# Patient Record
Sex: Female | Born: 1975 | Race: White | Hispanic: No | Marital: Married | Smoking: Never smoker
Health system: Southern US, Community
[De-identification: ages and names within clinical notes are randomized; demographics above are authoritative.]

## PROBLEM LIST (undated history)

## (undated) DIAGNOSIS — K625 Hemorrhage of anus and rectum: Secondary | ICD-10-CM

## (undated) DIAGNOSIS — K649 Unspecified hemorrhoids: Secondary | ICD-10-CM

## (undated) DIAGNOSIS — K5900 Constipation, unspecified: Secondary | ICD-10-CM

## (undated) DIAGNOSIS — N201 Calculus of ureter: Secondary | ICD-10-CM

## (undated) DIAGNOSIS — Z87442 Personal history of urinary calculi: Secondary | ICD-10-CM

## (undated) DIAGNOSIS — N6019 Diffuse cystic mastopathy of unspecified breast: Secondary | ICD-10-CM

## (undated) HISTORY — DX: Hemorrhage of anus and rectum: K62.5

## (undated) HISTORY — DX: Constipation, unspecified: K59.00

## (undated) HISTORY — PX: HAND SURGERY: SHX662

## (undated) HISTORY — DX: Unspecified hemorrhoids: K64.9

---
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
## 2006-04-09 ENCOUNTER — Inpatient Hospital Stay: Payer: Self-pay

## 2008-10-14 ENCOUNTER — Ambulatory Visit: Payer: Self-pay | Admitting: Internal Medicine

## 2009-05-29 ENCOUNTER — Emergency Department: Payer: Self-pay | Admitting: Emergency Medicine

## 2013-01-17 ENCOUNTER — Ambulatory Visit: Payer: Self-pay

## 2015-09-07 DIAGNOSIS — R55 Syncope and collapse: Secondary | ICD-10-CM | POA: Insufficient documentation

## 2017-09-26 DIAGNOSIS — N201 Calculus of ureter: Secondary | ICD-10-CM

## 2017-09-26 DIAGNOSIS — N6019 Diffuse cystic mastopathy of unspecified breast: Secondary | ICD-10-CM

## 2017-09-26 HISTORY — DX: Calculus of ureter: N20.1

## 2017-09-26 HISTORY — DX: Diffuse cystic mastopathy of unspecified breast: N60.19

## 2018-01-10 ENCOUNTER — Encounter: Payer: Self-pay | Admitting: Certified Nurse Midwife

## 2018-01-10 ENCOUNTER — Ambulatory Visit (INDEPENDENT_AMBULATORY_CARE_PROVIDER_SITE_OTHER): Payer: BLUE CROSS/BLUE SHIELD | Admitting: Certified Nurse Midwife

## 2018-01-10 VITALS — BP 102/62 | HR 70 | Ht 69.0 in | Wt 168.0 lb

## 2018-01-10 DIAGNOSIS — Z1239 Encounter for other screening for malignant neoplasm of breast: Secondary | ICD-10-CM

## 2018-01-10 DIAGNOSIS — Z124 Encounter for screening for malignant neoplasm of cervix: Secondary | ICD-10-CM | POA: Diagnosis not present

## 2018-01-10 DIAGNOSIS — Z01419 Encounter for gynecological examination (general) (routine) without abnormal findings: Secondary | ICD-10-CM

## 2018-01-10 DIAGNOSIS — Z13220 Encounter for screening for lipoid disorders: Secondary | ICD-10-CM

## 2018-01-10 DIAGNOSIS — Z1231 Encounter for screening mammogram for malignant neoplasm of breast: Secondary | ICD-10-CM | POA: Diagnosis not present

## 2018-01-10 NOTE — Progress Notes (Signed)
Gynecology Annual Exam  PCP: Patient, No Pcp Per  Chief Complaint:  Chief Complaint  Patient presents with  . Gynecologic Exam    History of Present Illness:This is 42 year old Caucasian/White female a married , G 2 P 2 0 0 2 , whose last normal menstrual period was 12/25/2017. . She presents today for her annual exam. She denies any significant gynecological problems. Does have hemorrhoids which flare occasionally and cause some bleeding. Since her last annual GYN exam on 02/01/2016, she has had no significant changes in her general health history.  Her last pap was approximately 2 years ago (02/01/2016) and was NIL/neg HRHPV. Her menses are regular. They occur every month, last 5-6 days, with 2 heavier days requiring a pad and tampon change q 3-4 hours, and are without clots. She denies dysmenorrhea. She is sexually active and vasectomy is her current form of contraception.  She performs breast self-exams monthly. She reports no breast symptoms. Her last mammogram was 01/17/2013 and was benign. There is no family history of breast cancer.  There is no family history of colon cancer.  The patient has not had a lipid panel test.  The patient does not exercise regularly. Currently does not take an OTC calcium supplement, but gets adequate calcium from her diet.  The patient does not smoke.  She does not drink alcohol.     Review of Systems: Review of Systems  Constitutional: Negative for chills, fever and weight loss.  HENT: Negative for congestion, sinus pain and sore throat.   Eyes: Negative for blurred vision and pain.  Respiratory: Negative for hemoptysis, shortness of breath and wheezing.   Cardiovascular: Negative for chest pain, palpitations and leg swelling.  Gastrointestinal: Negative for abdominal pain, blood in stool, diarrhea, heartburn, nausea and vomiting.       Positive for hemorrhoids/ bleeding from hemorrhoids occasionally; positive for reflux-uses Prilosec 3x/month    Genitourinary: Negative for dysuria, frequency, hematuria and urgency.  Musculoskeletal: Negative for back pain, joint pain and myalgias.  Skin: Negative for itching and rash.  Neurological: Negative for dizziness, tingling and headaches.  Endo/Heme/Allergies: Negative for environmental allergies and polydipsia. Does not bruise/bleed easily.       Negative for hirsutism   Psychiatric/Behavioral: Negative for depression. The patient is not nervous/anxious and does not have insomnia.     Past Medical History:  Past Medical History:  Diagnosis Date  . Hemorrhoids     Past Surgical History:  Past Surgical History:  Procedure Laterality Date  . HAND SURGERY     as a child    Family History:  Family History  Problem Relation Age of Onset  . Thyroid disease Mother   . Osteoporosis Mother   . Diabetes Father   . Hyperlipidemia Father   . Esophageal cancer Father 48  . Hypertension Father     Social History:  Social History   Socioeconomic History  . Marital status: Married    Spouse name: Not on file  . Number of children: 2  . Years of education: Not on file  . Highest education level: Not on file  Occupational History  . Not on file  Social Needs  . Financial resource strain: Not on file  . Food insecurity:    Worry: Not on file    Inability: Not on file  . Transportation needs:    Medical: Not on file    Non-medical: Not on file  Tobacco Use  . Smoking status:  Never Smoker  . Smokeless tobacco: Never Used  Substance and Sexual Activity  . Alcohol use: Never    Frequency: Never  . Drug use: Never  . Sexual activity: Yes    Birth control/protection: None, Other-see comments    Comment: vasectomy  Lifestyle  . Physical activity:    Days per week: 0 days    Minutes per session: 0 min  . Stress: Not at all  Relationships  . Social connections:    Talks on phone: Not on file    Gets together: Not on file    Attends religious service: Not on file     Active member of club or organization: Not on file    Attends meetings of clubs or organizations: Not on file    Relationship status: Not on file  . Intimate partner violence:    Fear of current or ex partner: Not on file    Emotionally abused: Not on file    Physically abused: Not on file    Forced sexual activity: Not on file  Other Topics Concern  . Not on file  Social History Narrative  . Not on file    Allergies:  No Known Allergies  Medications:none  Physical Exam Vitals: BP 102/62   Pulse 70   Ht 5\' 9"  (1.753 m)   Wt 168 lb (76.2 kg)   LMP 12/25/2017 (Exact Date)   BMI 24.81 kg/m   General: WF in NAD HEENT: normocephalic, anicteric Neck: no thyroid enlargement, no palpable nodules, no cervical lymphadenopathy  Pulmonary: No increased work of breathing, CTAB Cardiovascular: RRR, without murmur  Breast: Breast symmetrical, no tenderness, no palpable nodules or masses, no skin or nipple retraction present, no nipple discharge.  No axillary, infraclavicular or supraclavicular lymphadenopathy. Abdomen: Soft, non-tender, non-distended.  Umbilicus without lesions.  No hepatomegaly or masses palpable. No evidence of hernia. Genitourinary:  External: Normal external female genitalia.  Normal urethral meatus, normal Bartholin's and Skene's glands.    Vagina: Normal vaginal mucosa, no evidence of prolapse.    Cervix: Grossly normal in appearance, no bleeding, non-tender  Uterus: Anteverted, normal size, shape, and consistency, mobile, and non-tender  Adnexa: No adnexal masses, non-tender  Rectal: skin tag at 12 o'clock, not inflamed or bleeding  Lymphatic: no evidence of inguinal lymphadenopathy Extremities: no edema, erythema, or tenderness Neurologic: Grossly intact Psychiatric: mood appropriate, affect full     Assessment: 42 y.o. with normal annual gyn exam  Plan:   1) Breast cancer screening - recommend monthly self breast exam and annual screening mammograms.  Mammogram was ordered today.  2) Cervical cancer screening - Pap was done.   3) Cholesterol  screening ordered today   4) RTO in 1 year and prn. Declined GI referral at this time. To let me know if she desires consult in future for hemorrhoids.  Farrel Conners, CNM

## 2018-01-11 ENCOUNTER — Encounter: Payer: Self-pay | Admitting: Certified Nurse Midwife

## 2018-01-11 DIAGNOSIS — K649 Unspecified hemorrhoids: Secondary | ICD-10-CM | POA: Insufficient documentation

## 2018-01-11 LAB — LIPID PANEL
CHOLESTEROL TOTAL: 180 mg/dL (ref 100–199)
Chol/HDL Ratio: 2.5 ratio (ref 0.0–4.4)
HDL: 71 mg/dL (ref >39)
LDL Calculated: 100 mg/dL — ABNORMAL HIGH (ref 0–99)
Triglycerides: 46 mg/dL (ref 0–149)
VLDL CHOLESTEROL CAL: 9 mg/dL (ref 5–40)

## 2018-01-13 LAB — IGP,RFX APTIMA HPV ALL PTH: PAP Smear Comment: 0

## 2018-01-15 ENCOUNTER — Encounter (INDEPENDENT_AMBULATORY_CARE_PROVIDER_SITE_OTHER): Payer: Self-pay

## 2018-01-31 ENCOUNTER — Ambulatory Visit
Admission: RE | Admit: 2018-01-31 | Discharge: 2018-01-31 | Disposition: A | Discharge status: Home / Self Care | Payer: BLUE CROSS/BLUE SHIELD | Source: Ambulatory Visit | Attending: Certified Nurse Midwife | Admitting: Certified Nurse Midwife

## 2018-01-31 DIAGNOSIS — Z1231 Encounter for screening mammogram for malignant neoplasm of breast: Secondary | ICD-10-CM | POA: Insufficient documentation

## 2018-01-31 DIAGNOSIS — Z1239 Encounter for other screening for malignant neoplasm of breast: Secondary | ICD-10-CM

## 2018-02-02 ENCOUNTER — Other Ambulatory Visit: Payer: Self-pay | Admitting: Certified Nurse Midwife

## 2018-02-02 DIAGNOSIS — R928 Other abnormal and inconclusive findings on diagnostic imaging of breast: Secondary | ICD-10-CM

## 2018-02-02 DIAGNOSIS — N6310 Unspecified lump in the right breast, unspecified quadrant: Secondary | ICD-10-CM

## 2018-02-14 ENCOUNTER — Ambulatory Visit
Admission: RE | Admit: 2018-02-14 | Discharge: 2018-02-14 | Disposition: A | Discharge status: Home / Self Care | Payer: BLUE CROSS/BLUE SHIELD | Source: Ambulatory Visit | Attending: Certified Nurse Midwife | Admitting: Certified Nurse Midwife

## 2018-02-14 DIAGNOSIS — N6310 Unspecified lump in the right breast, unspecified quadrant: Secondary | ICD-10-CM

## 2018-02-14 DIAGNOSIS — R928 Other abnormal and inconclusive findings on diagnostic imaging of breast: Secondary | ICD-10-CM | POA: Diagnosis present

## 2018-02-15 ENCOUNTER — Encounter (INDEPENDENT_AMBULATORY_CARE_PROVIDER_SITE_OTHER): Payer: Self-pay

## 2018-02-15 ENCOUNTER — Encounter: Payer: Self-pay | Admitting: Certified Nurse Midwife

## 2018-02-15 DIAGNOSIS — N6019 Diffuse cystic mastopathy of unspecified breast: Secondary | ICD-10-CM | POA: Insufficient documentation

## 2018-04-02 ENCOUNTER — Emergency Department: Payer: BLUE CROSS/BLUE SHIELD

## 2018-04-02 ENCOUNTER — Other Ambulatory Visit: Payer: Self-pay

## 2018-04-02 ENCOUNTER — Emergency Department
Admission: EM | Admit: 2018-04-02 | Discharge: 2018-04-02 | Disposition: A | Discharge status: Home / Self Care | Payer: BLUE CROSS/BLUE SHIELD | Attending: Student in an Organized Health Care Education/Training Program | Admitting: Student in an Organized Health Care Education/Training Program

## 2018-04-02 ENCOUNTER — Telehealth: Payer: Self-pay

## 2018-04-02 DIAGNOSIS — Z79899 Other long term (current) drug therapy: Secondary | ICD-10-CM | POA: Insufficient documentation

## 2018-04-02 DIAGNOSIS — R109 Unspecified abdominal pain: Secondary | ICD-10-CM

## 2018-04-02 DIAGNOSIS — N201 Calculus of ureter: Secondary | ICD-10-CM | POA: Diagnosis not present

## 2018-04-02 DIAGNOSIS — R300 Dysuria: Secondary | ICD-10-CM

## 2018-04-02 LAB — URINALYSIS, COMPLETE (UACMP) WITH MICROSCOPIC
Bilirubin Urine: NEGATIVE
Glucose, UA: NEGATIVE mg/dL
Ketones, ur: NEGATIVE mg/dL
Nitrite: NEGATIVE
PH: 7 (ref 5.0–8.0)
Protein, ur: NEGATIVE mg/dL
RBC / HPF: >50 RBC/hpf — ABNORMAL HIGH (ref 0–5)
SPECIFIC GRAVITY, URINE: 1.005 (ref 1.005–1.030)
WBC, UA: >50 WBC/hpf — ABNORMAL HIGH (ref 0–5)

## 2018-04-02 LAB — BASIC METABOLIC PANEL
Anion gap: 7 (ref 5–15)
BUN: 10 mg/dL (ref 6–20)
CHLORIDE: 106 mmol/L (ref 98–111)
CO2: 27 mmol/L (ref 22–32)
CREATININE: 0.75 mg/dL (ref 0.44–1.00)
Calcium: 9.2 mg/dL (ref 8.9–10.3)
GFR calc Af Amer: >60 mL/min (ref >60)
GFR calc non Af Amer: >60 mL/min (ref >60)
GLUCOSE: 75 mg/dL (ref 70–99)
Potassium: 4.1 mmol/L (ref 3.5–5.1)
SODIUM: 140 mmol/L (ref 135–145)

## 2018-04-02 LAB — CBC
HEMATOCRIT: 38.8 % (ref 35.0–47.0)
Hemoglobin: 13.2 g/dL (ref 12.0–16.0)
MCH: 30 pg (ref 26.0–34.0)
MCHC: 34.1 g/dL (ref 32.0–36.0)
MCV: 87.9 fL (ref 80.0–100.0)
PLATELETS: 274 10*3/uL (ref 150–440)
RBC: 4.41 MIL/uL (ref 3.80–5.20)
RDW: 12.9 % (ref 11.5–14.5)
WBC: 11 10*3/uL (ref 3.6–11.0)

## 2018-04-02 LAB — GLUCOSE, CAPILLARY: GLUCOSE-CAPILLARY: 71 mg/dL (ref 70–99)

## 2018-04-02 LAB — POCT PREGNANCY, URINE: PREG TEST UR: NEGATIVE

## 2018-04-02 MED ORDER — TAMSULOSIN HCL 0.4 MG PO CAPS: 0.4000 mg | ORAL_CAPSULE | Freq: Every day | ORAL | 0 refills | Status: DC

## 2018-04-02 MED ORDER — CEPHALEXIN 500 MG PO CAPS: 500.0000 mg | ORAL_CAPSULE | Freq: Three times a day (TID) | ORAL | 0 refills | Status: AC

## 2018-04-02 MED ORDER — HYDROCODONE-ACETAMINOPHEN 5-325 MG PO TABS: 1.0000 | ORAL_TABLET | ORAL | 0 refills | Status: DC | PRN

## 2018-04-02 MED ORDER — ONDANSETRON HCL 4 MG PO TABS: 4.0000 mg | ORAL_TABLET | Freq: Every day | ORAL | 0 refills | Status: DC | PRN

## 2018-04-02 MED ORDER — KETOROLAC TROMETHAMINE 30 MG/ML IJ SOLN
30.0000 mg | Freq: Once | INTRAMUSCULAR | Status: AC
  Administered 2018-04-02: 30 mg via INTRAMUSCULAR
  Filled 2018-04-02: qty 1

## 2018-04-02 MED ORDER — CEPHALEXIN 500 MG PO CAPS
500.0000 mg | ORAL_CAPSULE | Freq: Once | ORAL | Status: AC
  Administered 2018-04-02: 500 mg via ORAL
  Filled 2018-04-02: qty 1

## 2018-04-02 NOTE — Telephone Encounter (Signed)
Pt reports she is a [facility] pt. She has signs of UTI & did at home test that is +for Leukocytes. Her father just passed away. She is uncertain when she could come for an apt. 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 EQ#[phone]

## 2018-04-02 NOTE — ED Notes (Signed)
Return to room from US

## 2018-04-02 NOTE — ED Triage Notes (Signed)
To ER via POV c/o urinary frequency, burning and near syncope that started at approx 12PM today. Reports bloody urine. Pt denies feeling faint at this time. Pt alert and oriented X4, active, cooperative, pt in NAD. RR even and unlabored, color WNL.

## 2018-04-02 NOTE — ED Provider Notes (Signed)
[facility] Emergency Department Provider Note    First MD Initiated Contact with Patient 04/02/18 1609     (approximate)  I have reviewed the triage vital signs and the nursing notes.   HISTORY  Chief Complaint Hematuria; Urinary Frequency; and Near Syncope    HPI Danielle Love is a 42 y.o. female no significant past medical history presents to the ER with chief complaint of suprapubic discomfort with burning dysuria and increased urinary frequency with hematuria that she noted that started today around 12 PM.  States that she is also been having some left lower quadrant left flank pain burning in nature that she is noticed over the past several days.  No fevers.  No nausea or vomiting.  States that while at work she felt like she was nearly about to pass out but that has resolved.  Denies any chest pain or shortness of breath.  States that discomfort is mild to moderate.    Past Medical History:  Diagnosis Date  . Hemorrhoids    Family History  Problem Relation Age of Onset  . Thyroid disease Mother   . Osteoporosis Mother   . Diabetes Father   . Hyperlipidemia Father   . Esophageal cancer Father 75  . Hypertension Father   . Breast cancer Neg Hx    Past Surgical History:  Procedure Laterality Date  . HAND SURGERY     as a child   Patient Active Problem List   Diagnosis Date Noted  . Fibrocystic breast changes 02/15/2018  . Hemorrhoids 01/11/2018      Prior to Admission medications   Medication Sig Start Date End Date Taking? Authorizing Provider  cephALEXin (KEFLEX) 500 MG capsule Take 1 capsule (500 mg total) by mouth 3 (three) times daily for 7 days. 04/02/18 04/09/18  Willy Eddy, MD  HYDROcodone-acetaminophen (NORCO) 5-325 MG tablet Take 1 tablet by mouth every 4 (four) hours as needed for moderate pain. 04/02/18   Willy Eddy, MD  ondansetron (ZOFRAN) 4 MG tablet Take 1 tablet (4 mg total) by mouth daily as needed for nausea  or vomiting. 04/02/18 04/02/19  Willy Eddy, MD  tamsulosin (FLOMAX) 0.4 MG CAPS capsule Take 1 capsule (0.4 mg total) by mouth daily after supper. 04/02/18   Willy Eddy, MD    Allergies Patient has no known allergies.    Social History Social History   Tobacco Use  . Smoking status: Never Smoker  . Smokeless tobacco: Never Used  Substance Use Topics  . Alcohol use: Never    Frequency: Never  . Drug use: Never    Review of Systems Patient denies headaches, rhinorrhea, blurry vision, numbness, shortness of breath, chest pain, edema, cough, abdominal pain, nausea, vomiting, diarrhea, dysuria, fevers, rashes or hallucinations unless otherwise stated above in HPI. ____________________________________________   PHYSICAL EXAM:  VITAL SIGNS: Vitals:   04/02/18 1512  BP: 118/74  Pulse: 98  Resp: 18  Temp: 98.2 F (36.8 C)  SpO2: 100%    Constitutional: Alert and oriented.  Eyes: Conjunctivae are normal.  Head: Atraumatic. Nose: No congestion/rhinnorhea. Mouth/Throat: Mucous membranes are moist.   Neck: No stridor. Painless ROM.  Cardiovascular: Normal rate, regular rhythm. Grossly normal heart sounds.  Good peripheral circulation. Respiratory: Normal respiratory effort.  No retractions. Lungs CTAB. Gastrointestinal: Soft and nontender. No distention. No abdominal bruits. No CVA tenderness. Genitourinary: deferred Musculoskeletal: No lower extremity tenderness nor edema.  No joint effusions. Neurologic:  Normal speech and language. No gross focal neurologic  deficits are appreciated. No facial droop Skin:  Skin is warm, dry and intact. No rash noted. Psychiatric: Mood and affect are normal. Speech and behavior are normal.  ____________________________________________   LABS (all labs ordered are listed, but only abnormal results are displayed)  Results for orders placed or performed during the hospital encounter of 04/02/18 (from the past 24 hour(s))  Basic  metabolic panel     Status: None   Collection Time: 04/02/18  3:18 PM  Result Value Ref Range   Sodium 140 135 - 145 mmol/L   Potassium 4.1 3.5 - 5.1 mmol/L   Chloride 106 98 - 111 mmol/L   CO2 27 22 - 32 mmol/L   Glucose, Bld 75 70 - 99 mg/dL   BUN 10 6 - 20 mg/dL   Creatinine, Ser 1.61 0.44 - 1.00 mg/dL   Calcium 9.2 8.9 - 09.6 mg/dL   GFR calc non Af Amer >60 >60 mL/min   GFR calc Af Amer >60 >60 mL/min   Anion gap 7 5 - 15  CBC     Status: None   Collection Time: 04/02/18  3:18 PM  Result Value Ref Range   WBC 11.0 3.6 - 11.0 K/uL   RBC 4.41 3.80 - 5.20 MIL/uL   Hemoglobin 13.2 12.0 - 16.0 g/dL   HCT 04.5 40.9 - 81.1 %   MCV 87.9 80.0 - 100.0 fL   MCH 30.0 26.0 - 34.0 pg   MCHC 34.1 32.0 - 36.0 g/dL   RDW 91.4 78.2 - 95.6 %   Platelets 274 150 - 440 K/uL  Glucose, capillary     Status: None   Collection Time: 04/02/18  3:22 PM  Result Value Ref Range   Glucose-Capillary 71 70 - 99 mg/dL  Urinalysis, Complete w Microscopic     Status: Abnormal   Collection Time: 04/02/18  4:09 PM  Result Value Ref Range   Color, Urine YELLOW (A) YELLOW   APPearance HAZY (A) CLEAR   Specific Gravity, Urine 1.005 1.005 - 1.030   pH 7.0 5.0 - 8.0   Glucose, UA NEGATIVE NEGATIVE mg/dL   Hgb urine dipstick LARGE (A) NEGATIVE   Bilirubin Urine NEGATIVE NEGATIVE   Ketones, ur NEGATIVE NEGATIVE mg/dL   Protein, ur NEGATIVE NEGATIVE mg/dL   Nitrite NEGATIVE NEGATIVE   Leukocytes, UA MODERATE (A) NEGATIVE   RBC / HPF >50 (H) 0 - 5 RBC/hpf   WBC, UA >50 (H) 0 - 5 WBC/hpf   Bacteria, UA FEW (A) NONE SEEN   Squamous Epithelial / LPF 0-5 0 - 5   Mucus PRESENT   Pregnancy, urine POC     Status: None   Collection Time: 04/02/18  4:13 PM  Result Value Ref Range   Preg Test, Ur NEGATIVE NEGATIVE   ____________________________________________  EKG My review and personal interpretation at Time: 15:18   Indication: near syncope  Rate: 99  Rhythm: sinus Axis: normal Other: normal intervals, no  stemi ____________________________________________  RADIOLOGY  I personally reviewed all radiographic images ordered to evaluate for the above acute complaints and reviewed radiology reports and findings.  These findings were personally discussed with the patient.  Please see medical record for radiology report.  ____________________________________________   PROCEDURES  Procedure(s) performed:  Procedures    Critical Care performed: no ____________________________________________   INITIAL IMPRESSION / ASSESSMENT AND PLAN / ED COURSE  Pertinent labs & imaging results that were available during my care of the patient were reviewed by me and considered in  my medical decision making (see chart for details).   DDX: stone, pyelo, dysrythmia, dehydration, electrolyte abn, ectopic  Danielle Love is a 42 y.o. who presents to the ED with p/w dysuria and hematuria.  No fevers, no systemic symptoms. Afebrile in ED. Exam as above. Flank TTP, otherwise abdominal exam is benign. No peritoneal signs.  Denies any vaginal bleeding or discharge.  She is not pregnant.  Possible kidney stone, cystitis, or pyelonephritis. UPT negative. UA with whites, reds and few bacteria with few squams. KUB with left 42mm stone and renal US with mild hydro but bilateral ureteral jets. Clinical picture is not consistent with appendicitis, diverticulitis, pancreatitis, cholecystitis, bowel perforation, aortic dissection, splenic injury or acute abdominal process at this time.  Pain improved, tolerating PO. Repeat ABD exam benign, will plan supportive treatment and early follow up.        As part of my medical decision making, I reviewed the following data within the electronic MEDICAL RECORD NUMBER Nursing notes reviewed and incorporated, Labs reviewed, notes from prior ED visits/   ____________________________________________   FINAL CLINICAL IMPRESSION(S) / ED DIAGNOSES  Final diagnoses:  Dysuria  Acute left  flank pain  Ureterolithiasis      NEW MEDICATIONS STARTED DURING THIS VISIT:  New Prescriptions   CEPHALEXIN (KEFLEX) 500 MG CAPSULE    Take 1 capsule (500 mg total) by mouth 3 (three) times daily for 7 days.   HYDROCODONE-ACETAMINOPHEN (NORCO) 5-325 MG TABLET    Take 1 tablet by mouth every 4 (four) hours as needed for moderate pain.   ONDANSETRON (ZOFRAN) 4 MG TABLET    Take 1 tablet (4 mg total) by mouth daily as needed for nausea or vomiting.   TAMSULOSIN (FLOMAX) 0.4 MG CAPS CAPSULE    Take 1 capsule (0.4 mg total) by mouth daily after supper.     Note:  This document was prepared using Dragon voice recognition software and may include unintentional dictation errors.    Willy Eddy, MD 04/02/18 [phone]

## 2018-04-02 NOTE — ED Notes (Signed)
AAOx3.  Skin warm and dry.  NAD 

## 2018-04-02 NOTE — Telephone Encounter (Signed)
PAtient called, but is now in the ER getting some tests. Went to the Urgent Care and they sent her to the ER.

## 2018-04-02 NOTE — Discharge Instructions (Signed)

## 2018-04-02 NOTE — ED Notes (Signed)
Has copy of urinalysis performed at [facility].

## 2018-04-29 NOTE — Progress Notes (Signed)
05/01/2018 5:24 PM   Danielle Love [birth year] [id number]  Referring provider: Farrel Conners, CNM [address] [place], [place] [address]  Chief Complaint  Patient presents with  . Nephrolithiasis    er f/u 3mm Stone    HPI: Patient is a 42 year old Caucasian female who was referred by [facility] ED for nephrolithiasis.    She presented to the ED on 04/02/2018 with the complaints of suprapubic pain, hematuria and increased frequency.  KUB noted 3 mm calcification is identified in the left hemipelvis that could potentially represent a distal left ureteral stone and account for the patient's left-sided flank pain. An additional 5 mm rounded calcification is seen more peripherally in the right hemipelvis more likely to represent a phlebolith though potentially a ureteral stone is also within differential considerations.  RUS noted mild LEFT hydronephrosis. No intrarenal calculi. BILATERAL ureteral jets were, with normal postvoid residual   Labs in the ED:  WBC count 11.0.   Creatinine 0.75.  UA > 50 RBC's and > 50 WBC's.     Meds given in the ED: Vicodin, Zofran and Flomax.    Prior urological history:  No stone history.     Today, she is not having flank pain.  Patient denies any gross hematuria, dysuria or suprapubic/flank pain.  Patient denies any fevers, chills, nausea or vomiting.   Her UA is negative.    She does not have a family history of nephrolithiasis.   She is on well water for x one year.  She does not drink sodas.  She drinks tea.  She does not have a high protein diet.     PMH: Past Medical History:  Diagnosis Date  . Hemorrhoids     Surgical History: Past Surgical History:  Procedure Laterality Date  . HAND SURGERY     as a child    Home Medications:  Allergies as of 05/01/2018   No Known Allergies     Medication List        Accurate as of 05/01/18 11:59 PM. Always use your most recent med list.          tamsulosin 0.4 MG Caps  capsule Commonly known as:  FLOMAX Take 1 capsule (0.4 mg total) by mouth daily after supper.       Allergies: No Known Allergies  Family History: Family History  Problem Relation Age of Onset  . Thyroid disease Mother   . Osteoporosis Mother   . Diabetes Father   . Hyperlipidemia Father   . Esophageal cancer Father 74  . Hypertension Father   . Breast cancer Neg Hx     Social History:  reports that she has never smoked. She has never used smokeless tobacco. She reports that she does not drink alcohol or use drugs.  ROS: UROLOGY Frequent Urination?: No Hard to postpone urination?: No Burning/pain with urination?: No Get up at night to urinate?: No Leakage of urine?: No Urine stream starts and stops?: No Trouble starting stream?: No Do you have to strain to urinate?: No Blood in urine?: No Urinary tract infection?: No Sexually transmitted disease?: No Injury to kidneys or bladder?: No Painful intercourse?: No Weak stream?: No Currently pregnant?: No Vaginal bleeding?: No Last menstrual period?: 04/04/18  Gastrointestinal Nausea?: No Vomiting?: No Indigestion/heartburn?: Yes Diarrhea?: No Constipation?: Yes  Constitutional Fever: No Night sweats?: No Weight loss?: No Fatigue?: No  Skin Skin rash/lesions?: No Itching?: No  Eyes Blurred vision?: No Double vision?: No  Ears/Nose/Throat Sore throat?: No  Sinus problems?: No  Hematologic/Lymphatic Swollen glands?: No Easy bruising?: No  Cardiovascular Leg swelling?: No Chest pain?: No  Respiratory Cough?: No Shortness of breath?: No  Endocrine Excessive thirst?: No  Musculoskeletal Back pain?: Yes Joint pain?: No  Neurological Headaches?: No Dizziness?: No  Psychologic Depression?: No Anxiety?: No  Physical Exam: BP 107/68 (BP Location: Left Arm, Patient Position: Sitting, Cuff Size: Normal)   Pulse 71   Ht 5\' 9"  (1.753 m)   Wt 169 lb 9.6 oz (76.9 kg)   BMI 25.05 kg/m    Constitutional:  Well nourished. Alert and oriented, No acute distress. HEENT: [place] AT, moist mucus membranes.  Trachea midline, no masses. Cardiovascular: No clubbing, cyanosis, or edema. Respiratory: Normal respiratory effort, no increased work of breathing. Skin: No rashes, bruises or suspicious lesions. Lymph: No cervical or inguinal adenopathy. Neurologic: Grossly intact, no focal deficits, moving all 4 extremities. Psychiatric: Normal mood and affect.  Laboratory Data: Lab Results  Component Value Date   WBC 4.2 05/01/2018   HGB 12.3 05/01/2018   HCT 37.8 05/01/2018   MCV 90 05/01/2018   PLT 262 05/01/2018    Lab Results  Component Value Date   CREATININE 0.87 05/01/2018    No results found for: PSA  No results found for: TESTOSTERONE  No results found for: HGBA1C  No results found for: TSH     Component Value Date/Time   CHOL 180 01/10/2018 1122   HDL 71 01/10/2018 1122   CHOLHDL 2.5 01/10/2018 1122   LDLCALC 100 (H) 01/10/2018 1122    No results found for: AST No results found for: ALT No components found for: ALKALINEPHOPHATASE No components found for: BILIRUBINTOTAL  No results found for: ESTRADIOL  Urinalysis See Epic and HPI I have reviewed the labs.   Pertinent Imaging: CLINICAL DATA:  Urinary frequency, burning and near syncope starting at 12 p.m. today. Left flank pain.  EXAM: ABDOMEN - 1 VIEW  COMPARISON:  None.  FINDINGS: Pelvic calcifications are identified measuring 5 mm on the right and 3 mm on the left potentially representing distal ureteral calculi versus phleboliths. Increased colonic stool burden is noted within the cecum, ascending colon through splenic flexure. No bowel obstruction or free air. Bowel loops obscure the renal shadows bilaterally. No acute osseous abnormality.  IMPRESSION: A 3 mm calcification is identified in the left hemipelvis that could potentially represent a distal left ureteral stone and account  for the patient's left-sided flank pain. An additional 5 mm rounded calcification is seen more peripherally in the right hemipelvis more likely to represent a phlebolith though potentially a ureteral stone is also within differential considerations.   Electronically Signed   By: Tollie Eth M.D.   On: 04/02/2018 16:52   Result History   CLINICAL DATA:  LEFT flank pain and hematuria, evaluate for stones.  EXAM: RENAL / URINARY TRACT ULTRASOUND COMPLETE  COMPARISON:  None.  FINDINGS: Right Kidney:  Length: 11.0 cm. Mild hydronephrosis. Echogenicity within normal limits. No mass or calculi visualized.  Left Kidney:  Length: 12.4 cm. Echogenicity within normal limits. No mass or hydronephrosis visualized.  Bladder:  Appears normal for degree of bladder distention. BILATERAL ureteral jets visualized. Postvoid volume was minimal, 19.5 mL.  IMPRESSION: Mild LEFT hydronephrosis. No intrarenal calculi. BILATERAL ureteral jets were, with normal postvoid residual.   Electronically Signed   By: Elsie Stain M.D.   On: 04/02/2018 17:19  I have independently reviewed the films.    Assessment & Plan:  1. Left ureteral stone Patient has most likely passed the stone, but she was unable to capture it We will pursue a CT renal stone study to evaluate if the stone is still present  2. Left hydronephrosis CT Renal stone study is pending  3. Microscopic hematuria UA today demonstrates no AMH   Return for I will call patient with results.  These notes generated with voice recognition software. I apologize for typographical errors.  Michiel Cowboy, PA-C  [facility] [address]  [address] [place], [place] [address] [phone]

## 2018-05-01 ENCOUNTER — Encounter: Payer: Self-pay | Admitting: Urology

## 2018-05-01 ENCOUNTER — Ambulatory Visit: Payer: BLUE CROSS/BLUE SHIELD | Admitting: Urology

## 2018-05-01 VITALS — BP 107/68 | HR 71 | Ht 69.0 in | Wt 169.6 lb

## 2018-05-01 DIAGNOSIS — N132 Hydronephrosis with renal and ureteral calculous obstruction: Secondary | ICD-10-CM

## 2018-05-01 DIAGNOSIS — R3129 Other microscopic hematuria: Secondary | ICD-10-CM | POA: Diagnosis not present

## 2018-05-01 DIAGNOSIS — N201 Calculus of ureter: Secondary | ICD-10-CM | POA: Diagnosis not present

## 2018-05-01 LAB — URINALYSIS, COMPLETE
BILIRUBIN UA: NEGATIVE
GLUCOSE, UA: NEGATIVE
Ketones, UA: NEGATIVE
Leukocytes, UA: NEGATIVE
NITRITE UA: NEGATIVE
PH UA: 5 (ref 5.0–7.5)
Protein, UA: NEGATIVE
Specific Gravity, UA: 1.025 (ref 1.005–1.030)
UUROB: 0.2 mg/dL (ref 0.2–1.0)

## 2018-05-01 LAB — MICROSCOPIC EXAMINATION: WBC, UA: NONE SEEN /hpf (ref 0–5)

## 2018-05-02 LAB — CBC WITH DIFFERENTIAL/PLATELET
Basophils Absolute: 0.1 10*3/uL (ref 0.0–0.2)
Basos: 1 %
EOS (ABSOLUTE): 0.1 10*3/uL (ref 0.0–0.4)
EOS: 3 %
HEMATOCRIT: 37.8 % (ref 34.0–46.6)
HEMOGLOBIN: 12.3 g/dL (ref 11.1–15.9)
Immature Grans (Abs): 0 10*3/uL (ref 0.0–0.1)
Immature Granulocytes: 0 %
LYMPHS ABS: 1 10*3/uL (ref 0.7–3.1)
Lymphs: 24 %
MCH: 29.4 pg (ref 26.6–33.0)
MCHC: 32.5 g/dL (ref 31.5–35.7)
MCV: 90 fL (ref 79–97)
MONOCYTES: 9 %
Monocytes Absolute: 0.4 10*3/uL (ref 0.1–0.9)
NEUTROS ABS: 2.6 10*3/uL (ref 1.4–7.0)
Neutrophils: 63 %
Platelets: 262 10*3/uL (ref 150–450)
RBC: 4.19 x10E6/uL (ref 3.77–5.28)
RDW: 12.1 % — ABNORMAL LOW (ref 12.3–15.4)
WBC: 4.2 10*3/uL (ref 3.4–10.8)

## 2018-05-02 LAB — BASIC METABOLIC PANEL
BUN / CREAT RATIO: 10 (ref 9–23)
BUN: 9 mg/dL (ref 6–24)
CHLORIDE: 105 mmol/L (ref 96–106)
CO2: 21 mmol/L (ref 20–29)
CREATININE: 0.87 mg/dL (ref 0.57–1.00)
Calcium: 9.1 mg/dL (ref 8.7–10.2)
GFR calc non Af Amer: 82 mL/min/{1.73_m2} (ref >59)
GFR, EST AFRICAN AMERICAN: 95 mL/min/{1.73_m2} (ref >59)
Glucose: 88 mg/dL (ref 65–99)
Potassium: 4.4 mmol/L (ref 3.5–5.2)
Sodium: 138 mmol/L (ref 134–144)

## 2018-05-04 LAB — CULTURE, URINE COMPREHENSIVE

## 2018-05-15 ENCOUNTER — Ambulatory Visit: Payer: BLUE CROSS/BLUE SHIELD

## 2018-06-01 ENCOUNTER — Telehealth: Payer: Self-pay | Admitting: Urology

## 2018-06-01 DIAGNOSIS — N132 Hydronephrosis with renal and ureteral calculous obstruction: Secondary | ICD-10-CM

## 2018-06-01 NOTE — Telephone Encounter (Signed)
That should be good.  I get the ultrasound results and call with the results.  Tell her I'm sorry about her grandfather.

## 2018-06-01 NOTE — Telephone Encounter (Signed)
Called pt she states that she cancelled CT scan because her grandfather passed away the day before it was scheduled. Pt states that she also has a $500 out of pocket for a CT so she would prefer an U/S. Order placed. Do I need to have pt do anything else?

## 2018-06-01 NOTE — Telephone Encounter (Signed)
Would you call Danielle Love and have her reschedule her CT scan?  If she doesn't want to have a CT, a renal ultrasound would be adequate.  We need to make sure her left kidney does not have residual hydronephrosis due to the stone.

## 2018-06-05 NOTE — Telephone Encounter (Signed)
Called pt informed her of the information below. Pt gave verbal understanding. Advised pt on where to go to have U/S performed.

## 2018-06-26 ENCOUNTER — Ambulatory Visit
Admission: RE | Admit: 2018-06-26 | Discharge: 2018-06-26 | Disposition: A | Discharge status: Home / Self Care | Payer: BLUE CROSS/BLUE SHIELD | Source: Ambulatory Visit | Attending: Urology | Admitting: Urology

## 2018-06-26 DIAGNOSIS — N132 Hydronephrosis with renal and ureteral calculous obstruction: Secondary | ICD-10-CM | POA: Insufficient documentation

## 2018-07-04 ENCOUNTER — Telehealth: Payer: Self-pay | Admitting: Urology

## 2018-07-04 ENCOUNTER — Other Ambulatory Visit: Payer: Self-pay | Admitting: Family Medicine

## 2018-07-04 DIAGNOSIS — N201 Calculus of ureter: Secondary | ICD-10-CM

## 2018-07-04 NOTE — Telephone Encounter (Signed)
Patient notified and will go get the KUB. The order is in, please sign order Thank you

## 2018-07-04 NOTE — Telephone Encounter (Signed)
Would you please let Teela know that I have reviewed her ultrasounds with the physician and the kidneys appear to be the same on all the images.  It is suggested to repeat the KUB as there was a calcification seen on the previous film that may have been an ureteral stone to ensure it has passed.

## 2018-07-18 ENCOUNTER — Ambulatory Visit
Admission: RE | Admit: 2018-07-18 | Discharge: 2018-07-18 | Disposition: A | Discharge status: Home / Self Care | Payer: BLUE CROSS/BLUE SHIELD | Source: Ambulatory Visit | Attending: Urology | Admitting: Urology

## 2018-07-18 DIAGNOSIS — N201 Calculus of ureter: Secondary | ICD-10-CM | POA: Insufficient documentation

## 2018-07-19 ENCOUNTER — Telehealth: Payer: Self-pay

## 2018-07-19 NOTE — Telephone Encounter (Signed)
-----   Message from Harle Battiest, PA-C sent at 07/19/2018  7:59 AM EDT ----- Please let Julie-Anne know that her X-ray was negative for stones.

## 2019-03-15 ENCOUNTER — Encounter: Payer: Self-pay | Admitting: Certified Nurse Midwife

## 2019-03-15 ENCOUNTER — Other Ambulatory Visit (HOSPITAL_COMMUNITY)
Admission: RE | Admit: 2019-03-15 | Discharge: 2019-03-15 | Disposition: A | Discharge status: Home / Self Care | Payer: BLUE CROSS/BLUE SHIELD | Source: Ambulatory Visit | Attending: Certified Nurse Midwife | Admitting: Certified Nurse Midwife

## 2019-03-15 ENCOUNTER — Ambulatory Visit (INDEPENDENT_AMBULATORY_CARE_PROVIDER_SITE_OTHER): Payer: BLUE CROSS/BLUE SHIELD | Admitting: Certified Nurse Midwife

## 2019-03-15 ENCOUNTER — Other Ambulatory Visit: Payer: Self-pay

## 2019-03-15 VITALS — BP 100/60 | Ht 69.0 in | Wt 163.0 lb

## 2019-03-15 DIAGNOSIS — Z23 Encounter for immunization: Secondary | ICD-10-CM | POA: Diagnosis not present

## 2019-03-15 DIAGNOSIS — K921 Melena: Secondary | ICD-10-CM

## 2019-03-15 DIAGNOSIS — Z01419 Encounter for gynecological examination (general) (routine) without abnormal findings: Secondary | ICD-10-CM | POA: Diagnosis not present

## 2019-03-15 DIAGNOSIS — R198 Other specified symptoms and signs involving the digestive system and abdomen: Secondary | ICD-10-CM

## 2019-03-15 DIAGNOSIS — Z124 Encounter for screening for malignant neoplasm of cervix: Secondary | ICD-10-CM | POA: Insufficient documentation

## 2019-03-15 DIAGNOSIS — K649 Unspecified hemorrhoids: Secondary | ICD-10-CM

## 2019-03-15 DIAGNOSIS — Z1239 Encounter for other screening for malignant neoplasm of breast: Secondary | ICD-10-CM

## 2019-03-15 NOTE — Progress Notes (Signed)
Gynecology Annual Exam  PCP: Farrel Conners, CNM  Chief Complaint:  Chief Complaint  Patient presents with  . Gynecologic Exam    constipation/hemorrhoids/New problem Diarrhea    History of Present Illness:This is 43 year old Caucasian/White female a married , G 2 P 2 0 0 2 , whose last normal menstrual period was 03/01/2019. Marland Kitchen She presents today for her annual exam. She denies any significant gynecological problems. Does have hemorrhoids which flare occasionally and cause some bleeding.In the past has had more of a problem with constipation, but in the last 2 months having problems with loose stools. She would like a referral to GI. Since her last annual 01/10/2018, she has also been diagnosed with ureteral stones. Menses are regular. They are every month and last 5-6 days, with two heavier days with quarter sized clots,  requiring pad changes every 2 hours. She denies dysmenorrhea. Her last pap was 01/10/2018 and was NIL.Marland Kitchen She is sexually active and vasectomy is her current form of contraception.  She performs breast self-exams monthly. She reports no breast symptoms. Her last mammogram was 01/31/2018 and was Birads 0. Additional views and ultrasound revealed 3 small cysts in the area of concern and the mammogram was rated a Birads 2. There is no family history of breast cancer.  There is no family history of colon cancer.  She had a lipid panel 2019 which was normal. The patient doest exercise regularly.  Currently does not take an OTC calcium supplement, but gets adequate calcium from her diet.  The patient does not smoke.  She does not drink alcohol.     Review of Systems: Review of Systems  Constitutional: Negative for chills, fever and weight loss.  HENT: Negative for congestion, sinus pain and sore throat.   Eyes: Negative for blurred vision and pain.  Respiratory: Negative for hemoptysis, shortness of breath and wheezing.   Cardiovascular: Negative for chest pain,  palpitations and leg swelling.  Gastrointestinal: Negative for abdominal pain, heartburn, nausea and vomiting.       Positive for hemorrhoids/ bleeding from hemorrhoids occasionally; alternating constipation and diarrhea Genitourinary: Negative for dysuria, frequency, hematuria and urgency.  Musculoskeletal: Negative for back pain, joint pain and myalgias.  Skin: Negative for itching and rash.  Neurological: Negative for dizziness, tingling and headaches.  Endo/Heme/Allergies: Negative for environmental allergies and polydipsia. Does not bruise/bleed easily.       Negative for hirsutism   Psychiatric/Behavioral: Negative for depression. The patient is not nervous/anxious and does not have insomnia.     Past Medical History:  Past Medical History:  Diagnosis Date  . Constipation   . Fibrocystic breast changes 2019   Birads 2 mammogram  . Hemorrhoids   . Left ureteral stone 2019    Past Surgical History:  Past Surgical History:  Procedure Laterality Date  . HAND SURGERY     as a child    Family History:  Family History  Problem Relation Age of Onset  . Thyroid disease Mother   . Osteoporosis Mother   . Diabetes Father   . Hyperlipidemia Father   . Esophageal cancer Father 62  . Hypertension Father   . Breast cancer Neg Hx     Social History:  Social History   Socioeconomic History  . Marital status: Married    Spouse name: Not on file  . Number of children: 2  . Years of education: Not on file  . Highest education level: Master's degree (e.g., MA,  MS, MEng, MEd, MSW, [facility])  Occupational History  . Occupation: Economist  Social Needs  . Financial resource strain: Not on file  . Food insecurity    Worry: Not on file    Inability: Not on file  . Transportation needs    Medical: Not on file    Non-medical: Not on file  Tobacco Use  . Smoking status: Never Smoker  . Smokeless tobacco: Never Used  Substance and Sexual Activity  . Alcohol use:  Never    Frequency: Never  . Drug use: Never  . Sexual activity: Yes    Birth control/protection: None, Other-see comments    Comment: vasectomy  Lifestyle  . Physical activity    Days per week: 4 days    Minutes per session: 40 min  . Stress: Not at all  Relationships  . Social Herbalist on phone: Not on file    Gets together: Not on file    Attends religious service: Not on file    Active member of club or organization: Not on file    Attends meetings of clubs or organizations: Not on file    Relationship status: Not on file  . Intimate partner violence    Fear of current or ex partner: Not on file    Emotionally abused: Not on file    Physically abused: Not on file    Forced sexual activity: Not on file  Other Topics Concern  . Not on file  Social History Narrative  . Not on file    Allergies:  No Known Allergies  Medications:none  Physical Exam Vitals: BP 100/60 (BP Location: Right Arm, Patient Position: Sitting, Cuff Size: Normal)   Ht 5\' 9"  (1.753 m)   Wt 163 lb (73.9 kg)   LMP 03/01/2019   BMI 24.07 kg/m   General: WF in NAD HEENT: normocephalic, anicteric Neck: no thyroid enlargement, no palpable nodules, no cervical lymphadenopathy  Pulmonary: No increased work of breathing, CTAB Cardiovascular: RRR, without murmur  Breast: Breast symmetrical, no tenderness, no palpable nodules or masses, no skin or nipple retraction present, no nipple discharge.  No axillary, infraclavicular or supraclavicular lymphadenopathy. Abdomen: Soft, non-tender, non-distended.  Umbilicus without lesions.  No hepatomegaly or masses palpable. No evidence of hernia. Genitourinary:  External: Normal external female genitalia.  Normal urethral meatus, normal Bartholin's and Skene's glands.    Vagina: Normal vaginal mucosa, no evidence of prolapse.    Cervix: Grossly normal in appearance, no bleeding, non-tender  Uterus: Anteverted, normal size, shape, and consistency,  mobile, and non-tender  Adnexa: No adnexal masses, non-tender  Rectal: skin tag at 12 o'clock, not inflamed or bleeding  Lymphatic: no evidence of inguinal lymphadenopathy Extremities: no edema, erythema, or tenderness Neurologic: Grossly intact Psychiatric: mood appropriate, affect full     Assessment: 43 y.o. with normal annual gyn exam Hemorrhoids Blood in stool Change in bowel habits  Plan:   1) Breast cancer screening - recommend monthly self breast exam and annual screening mammograms. Mammogram was ordered today.  2) Cervical cancer screening - Pap was done.   3) Discussed calcium and vitamin D3 requirements. Recommend 600 IU vitamin D daily.  4) GI consult to Dr Marius Ditch  5) Requests TDAP today-given  6) RTO in 1 year and prn.   Dalia Heading, CNM         Tdap past due. Given today IM Left deloitd. Patient tolerated well.

## 2019-03-17 ENCOUNTER — Encounter: Payer: Self-pay | Admitting: Certified Nurse Midwife

## 2019-03-19 ENCOUNTER — Encounter: Payer: Self-pay | Admitting: *Deleted

## 2019-03-19 LAB — CYTOLOGY - PAP
Adequacy: ABSENT
Diagnosis: NEGATIVE
HPV: NOT DETECTED

## 2019-03-26 ENCOUNTER — Other Ambulatory Visit: Payer: Self-pay

## 2019-03-26 ENCOUNTER — Ambulatory Visit
Admission: RE | Admit: 2019-03-26 | Discharge: 2019-03-26 | Disposition: A | Discharge status: Home / Self Care | Payer: BLUE CROSS/BLUE SHIELD | Source: Ambulatory Visit | Attending: Certified Nurse Midwife | Admitting: Certified Nurse Midwife

## 2019-03-26 DIAGNOSIS — Z1231 Encounter for screening mammogram for malignant neoplasm of breast: Secondary | ICD-10-CM | POA: Insufficient documentation

## 2019-03-26 DIAGNOSIS — Z1239 Encounter for other screening for malignant neoplasm of breast: Secondary | ICD-10-CM

## 2019-03-28 ENCOUNTER — Other Ambulatory Visit: Payer: Self-pay | Admitting: Certified Nurse Midwife

## 2019-03-28 DIAGNOSIS — R928 Other abnormal and inconclusive findings on diagnostic imaging of breast: Secondary | ICD-10-CM

## 2019-03-28 DIAGNOSIS — N6489 Other specified disorders of breast: Secondary | ICD-10-CM

## 2019-04-09 ENCOUNTER — Other Ambulatory Visit: Payer: Self-pay

## 2019-04-09 ENCOUNTER — Ambulatory Visit
Admission: RE | Admit: 2019-04-09 | Discharge: 2019-04-09 | Disposition: A | Discharge status: Home / Self Care | Payer: BLUE CROSS/BLUE SHIELD | Source: Ambulatory Visit | Attending: Certified Nurse Midwife | Admitting: Certified Nurse Midwife

## 2019-04-09 DIAGNOSIS — R928 Other abnormal and inconclusive findings on diagnostic imaging of breast: Secondary | ICD-10-CM | POA: Insufficient documentation

## 2019-04-09 DIAGNOSIS — N6489 Other specified disorders of breast: Secondary | ICD-10-CM | POA: Insufficient documentation

## 2019-04-24 ENCOUNTER — Encounter: Payer: Self-pay | Admitting: *Deleted

## 2019-04-24 ENCOUNTER — Ambulatory Visit: Payer: BLUE CROSS/BLUE SHIELD | Admitting: Gastroenterology

## 2019-04-24 DIAGNOSIS — K5900 Constipation, unspecified: Secondary | ICD-10-CM | POA: Insufficient documentation

## 2019-05-23 ENCOUNTER — Other Ambulatory Visit: Payer: Self-pay

## 2019-05-23 ENCOUNTER — Ambulatory Visit: Payer: BLUE CROSS/BLUE SHIELD | Admitting: Gastroenterology

## 2019-05-23 ENCOUNTER — Encounter: Payer: Self-pay | Admitting: Gastroenterology

## 2019-05-23 VITALS — BP 106/71 | HR 86 | Temp 98.2°F | Resp 17 | Ht 69.0 in | Wt 167.6 lb

## 2019-05-23 DIAGNOSIS — R12 Heartburn: Secondary | ICD-10-CM

## 2019-05-23 DIAGNOSIS — K625 Hemorrhage of anus and rectum: Secondary | ICD-10-CM

## 2019-05-23 DIAGNOSIS — K582 Mixed irritable bowel syndrome: Secondary | ICD-10-CM | POA: Diagnosis not present

## 2019-05-23 DIAGNOSIS — R1013 Epigastric pain: Secondary | ICD-10-CM | POA: Diagnosis not present

## 2019-05-23 DIAGNOSIS — K602 Anal fissure, unspecified: Secondary | ICD-10-CM

## 2019-05-23 NOTE — Progress Notes (Signed)
Danielle Repressohini R Claborn Janusz, MD [address]  [address]  [place], [place] [address]  Main: [phone]  Fax: [phone]    Gastroenterology Consultation  Referring Provider:     Farrel ConnersGutierrez, Colleen, CNM Primary Care Physician:  Farrel ConnersGutierrez, Colleen, CNM Primary Gastroenterologist:  Dr. Arlyss Repressohini R Kallee Nam Reason for Consultation:      Alternating constipation and diarrhea, epigastric pain, heartburn, rectal bleeding        HPI:   Danielle Love is a 43 y.o. female referred by Dr. Farrel ConnersGutierrez, Colleen, CNM  for consultation & management of alternating episodes of constipation and diarrhea, rectal bleeding as well as epigastric pain and heartburn  Alternating constipation and diarrhea, rectal bleeding: Patient reports he has been experiencing episodes of constipation and diarrhea for several years which have gotten worse lately and she would like to get this addressed/managed.  He reports having 1 or 2 hard bowel movements per week followed by 2 to 3 days of several nonbloody loose bowel movements.  Patient has not tried any over-the-counter therapies for constipation or diarrhea.  She denies abdominal bloating.  She does have abdominal cramps associated with increased bowel frequency.  She denies any weight loss.  She does notice bright red blood per rectum on wiping and in the toilet particularly during episodes of constipation. She does report severe rectal burning as well as sharp pain during episodes of constipation  Epigastric pain and heartburn: This has been ongoing for the last 2 to 3 months.  Describes it as burning in her chest as well as in the epigastric area.  Associated with nausea, fullness of stomach.  She drinks sweet tea daily.  She denies melena, right upper quadrant pain.  Patient has not seeked any medical attention for her GI symptoms to date.  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  Works 3 night shifts per week.  She  thinks her life is generally stressful.  Her husband is a Emergency planning/management officerpolice officer.  She has a 43 year old and 43 year old children  NSAIDs: None  Antiplts/Anticoagulants/Anti thrombotics: None  GI Procedures: None Father esophageal cancer, adenocarcinoma at age 43 Patient denies smoking, occasional alcohol use  Past Medical History:  Diagnosis Date  . Constipation   . Fibrocystic breast changes 2019   Birads 2 mammogram  . Hemorrhoids   . Left ureteral stone 2019    Past Surgical History:  Procedure Laterality Date  . HAND SURGERY     as a child    No current outpatient medications on file.  Family History  Problem Relation Age of Onset  . Thyroid disease Mother   . Osteoporosis Mother   . Diabetes Father   . Hyperlipidemia Father   . Esophageal cancer Father [id number]  . Hypertension Father   . Breast cancer Neg Hx      Social History   Tobacco Use  . Smoking status: Never Smoker  . Smokeless tobacco: Never Used  Substance Use Topics  . Alcohol use: Never    Frequency: Never  . Drug use: Never    Allergies as of 05/23/2019  . (No Known Allergies)    Review of Systems:    All systems reviewed and negative except where noted in HPI.   Physical Exam:  BP 106/71 (BP Location: Left Arm, Patient Position: Sitting, Cuff Size: Normal)   Pulse 86   Temp 98.2 F (36.8 C)   Resp 17   Ht 5\' 9"  (1.753 m)  Wt 167 lb 9.6 oz (76 kg)   BMI 24.75 kg/m  No LMP recorded.  General:   Alert,  Well-developed, well-nourished, pleasant and cooperative in NAD Head:  Normocephalic and atraumatic. Eyes:  Sclera clear, no icterus.   Conjunctiva pink. Ears:  Normal auditory acuity. Nose:  No deformity, discharge, or lesions. Mouth:  No deformity or lesions,oropharynx pink & moist. Neck:  Supple; no masses or thyromegaly. Lungs:  Respirations even and unlabored.  Clear throughout to auscultation.   No wheezes, crackles, or rhonchi. No acute distress. Heart:  Regular rate and rhythm; no  murmurs, clicks, rubs, or gallops. Abdomen:  Normal bowel sounds. Soft, non-tender and non-distended without masses, hepatosplenomegaly or hernias noted.  No guarding or rebound tenderness.   Rectal: Mild burning tenderness in the posterior wall of the anal canal Msk:  Symmetrical without gross deformities. Good, equal movement & strength bilaterally. Pulses:  Normal pulses noted. Extremities:  No clubbing or edema.  No cyanosis. Neurologic:  Alert and oriented x3;  grossly normal neurologically. Skin:  Intact without significant lesions or rashes. No jaundice. Psych:  Alert and cooperative. Normal mood and affect.  Imaging Studies: No abdominal imaging  Assessment and Plan:   [name] is a 43 y.o. female with no significant past medical history seen in consultation for chronic history of alternating episodes of constipation and diarrhea, intermittent bright red blood per rectum, epigastric pain, heartburn  Epigastric pain, heartburn Recommend EGD with gastric and duodenal biopsies If unremarkable, will try PPI  Alternating episodes of constipation and diarrhea: Most likely functional Reiterated on high-fiber diet, fiber supplements, information provided Trial of Linzess 145 MCG daily, samples provided  Rectal bleeding Recommend diagnostic colonoscopy  Possible anal fissure Recommend 0.125% nitroglycerin with lidocaine per rectum, instructions provided   Follow up in 4 to 6 weeks   Cephas Darby, MD

## 2019-05-23 NOTE — Patient Instructions (Signed)

## 2019-05-28 ENCOUNTER — Telehealth: Payer: Self-pay | Admitting: Gastroenterology

## 2019-05-28 NOTE — Telephone Encounter (Signed)
Patient called to schedule her colonoscopy & EGD. Please call her to schedule when she has her schedule.

## 2019-05-29 NOTE — Telephone Encounter (Signed)
LVM asking pt to return call concerning procedure scheduling

## 2019-06-06 ENCOUNTER — Other Ambulatory Visit: Payer: Self-pay

## 2019-06-06 DIAGNOSIS — K219 Gastro-esophageal reflux disease without esophagitis: Secondary | ICD-10-CM

## 2019-06-06 DIAGNOSIS — R1013 Epigastric pain: Secondary | ICD-10-CM

## 2019-06-06 DIAGNOSIS — K625 Hemorrhage of anus and rectum: Secondary | ICD-10-CM

## 2019-06-25 ENCOUNTER — Other Ambulatory Visit
Admission: RE | Admit: 2019-06-25 | Discharge: 2019-06-25 | Disposition: A | Discharge status: Home / Self Care | Payer: BLUE CROSS/BLUE SHIELD | Source: Ambulatory Visit | Attending: Gastroenterology | Admitting: Gastroenterology

## 2019-06-25 DIAGNOSIS — Z01812 Encounter for preprocedural laboratory examination: Secondary | ICD-10-CM | POA: Insufficient documentation

## 2019-06-25 DIAGNOSIS — Z20828 Contact with and (suspected) exposure to other viral communicable diseases: Secondary | ICD-10-CM | POA: Insufficient documentation

## 2019-06-26 ENCOUNTER — Encounter: Payer: Self-pay | Admitting: Gastroenterology

## 2019-06-26 LAB — SARS CORONAVIRUS 2 (TAT 6-24 HRS): SARS Coronavirus 2: NEGATIVE

## 2019-06-26 MED ORDER — PEG 3350-KCL-NA BICARB-NACL 420 G PO SOLR: 4000.0000 mL | Freq: Once | ORAL | 0 refills | Status: AC

## 2019-06-26 NOTE — Telephone Encounter (Signed)
Sent in [facility] for patient

## 2019-06-28 ENCOUNTER — Other Ambulatory Visit: Payer: Self-pay

## 2019-06-28 ENCOUNTER — Encounter
Admission: RE | Disposition: A | Discharge status: Home / Self Care | Payer: Self-pay | Source: Home / Self Care | Attending: Gastroenterology

## 2019-06-28 ENCOUNTER — Ambulatory Visit: Payer: BLUE CROSS/BLUE SHIELD | Admitting: Anesthesiology

## 2019-06-28 ENCOUNTER — Encounter: Payer: Self-pay | Admitting: *Deleted

## 2019-06-28 ENCOUNTER — Ambulatory Visit
Admission: RE | Admit: 2019-06-28 | Discharge: 2019-06-28 | Disposition: A | Discharge status: Home / Self Care | Payer: BLUE CROSS/BLUE SHIELD | Attending: Gastroenterology | Admitting: Gastroenterology

## 2019-06-28 DIAGNOSIS — K2950 Unspecified chronic gastritis without bleeding: Secondary | ICD-10-CM | POA: Insufficient documentation

## 2019-06-28 DIAGNOSIS — K644 Residual hemorrhoidal skin tags: Secondary | ICD-10-CM | POA: Insufficient documentation

## 2019-06-28 DIAGNOSIS — K449 Diaphragmatic hernia without obstruction or gangrene: Secondary | ICD-10-CM | POA: Diagnosis not present

## 2019-06-28 DIAGNOSIS — R1013 Epigastric pain: Secondary | ICD-10-CM | POA: Diagnosis not present

## 2019-06-28 DIAGNOSIS — K317 Polyp of stomach and duodenum: Secondary | ICD-10-CM | POA: Insufficient documentation

## 2019-06-28 DIAGNOSIS — R12 Heartburn: Secondary | ICD-10-CM

## 2019-06-28 DIAGNOSIS — K219 Gastro-esophageal reflux disease without esophagitis: Secondary | ICD-10-CM

## 2019-06-28 DIAGNOSIS — K625 Hemorrhage of anus and rectum: Secondary | ICD-10-CM | POA: Diagnosis not present

## 2019-06-28 HISTORY — PX: COLONOSCOPY WITH PROPOFOL: SHX5780

## 2019-06-28 HISTORY — PX: ESOPHAGOGASTRODUODENOSCOPY (EGD) WITH PROPOFOL: SHX5813

## 2019-06-28 SURGERY — ESOPHAGOGASTRODUODENOSCOPY (EGD) WITH PROPOFOL
Anesthesia: General

## 2019-06-28 MED ORDER — LIDOCAINE HCL (CARDIAC) PF 100 MG/5ML IV SOSY
PREFILLED_SYRINGE | INTRAVENOUS | Status: DC | PRN
  Administered 2019-06-28: 50 mg via INTRAVENOUS

## 2019-06-28 MED ORDER — PROPOFOL 500 MG/50ML IV EMUL
INTRAVENOUS | Status: DC | PRN
  Administered 2019-06-28: 175 ug/kg/min via INTRAVENOUS

## 2019-06-28 MED ORDER — SODIUM CHLORIDE 0.9 % IV SOLN
INTRAVENOUS | Status: DC
  Administered 2019-06-28: 09:00:00 via INTRAVENOUS

## 2019-06-28 MED ORDER — PROPOFOL 10 MG/ML IV BOLUS
INTRAVENOUS | Status: DC | PRN
  Administered 2019-06-28: 20 mg via INTRAVENOUS
  Administered 2019-06-28: 70 mg via INTRAVENOUS
  Administered 2019-06-28: 50 mg via INTRAVENOUS
  Administered 2019-06-28: 10 mg via INTRAVENOUS

## 2019-06-28 MED ORDER — LINACLOTIDE 145 MCG PO CAPS: 145.0000 ug | ORAL_CAPSULE | Freq: Every day | ORAL | 2 refills | Status: DC

## 2019-06-28 NOTE — Transfer of Care (Signed)
Immediate Anesthesia Transfer of Care Note  Patient: Danielle Love  Procedure(s) Performed: ESOPHAGOGASTRODUODENOSCOPY (EGD) WITH PROPOFOL (N/A ) COLONOSCOPY WITH PROPOFOL (N/A )  Patient Location: PACU  Anesthesia Type:General  Level of Consciousness: drowsy  Airway & Oxygen Therapy: Patient Spontanous Breathing  Post-op Assessment: Report given to RN and Post -op Vital signs reviewed and stable  Post vital signs: Reviewed and stable  Last Vitals:  Vitals Value Taken Time  BP 105/71 06/28/19 1007  Temp 36.2 C 06/28/19 1007  Pulse 91 06/28/19 1007  Resp 18 06/28/19 1007  SpO2 100 % 06/28/19 1007    Last Pain:  Vitals:   06/28/19 1007  TempSrc: Tympanic  PainSc: Asleep         Complications: No apparent anesthesia complications

## 2019-06-28 NOTE — Op Note (Signed)
[facility] Gastroenterology Patient Name: Danielle Love Procedure Date: 06/28/2019 9:30 AM MRN: [id number] Account #: [id number] Date of Birth: 1975/12/21 Admit Type: Outpatient Age: 43 Room: [facility] ENDO ROOM 1 Gender: Female Note Status: Finalized Procedure:            Upper GI endoscopy Indications:          Dyspepsia, Heartburn Providers:            Lin Landsman MD, MD Referring MD:         [name] (Referring MD) Medicines:            Monitored Anesthesia Care Complications:        No immediate complications. Estimated blood loss: None. Procedure:            Pre-Anesthesia Assessment:                       - Prior to the procedure, a History and Physical was                        performed, and patient medications and allergies were                        reviewed. The patient is competent. The risks and                        benefits of the procedure and the sedation options and                        risks were discussed with the patient. All questions                        were answered and informed consent was obtained.                        Patient identification and proposed procedure were                        verified by the physician, the nurse, the                        anesthesiologist, the anesthetist and the technician in                        the pre-procedure area in the procedure room in the                        endoscopy suite. Mental Status Examination: alert and                        oriented. Airway Examination: normal oropharyngeal                        airway and neck mobility. Respiratory Examination:                        clear to auscultation. CV Examination: normal.                        Prophylactic Antibiotics: The patient does not require  prophylactic antibiotics. Prior Anticoagulants: The                        patient has taken no previous anticoagulant or   antiplatelet agents. ASA Grade Assessment: I - A                        normal, healthy patient. After reviewing the risks and                        benefits, the patient was deemed in satisfactory                        condition to undergo the procedure. The anesthesia plan                        was to use monitored anesthesia care (MAC). Immediately                        prior to administration of medications, the patient was                        re-assessed for adequacy to receive sedatives. The                        heart rate, respiratory rate, oxygen saturations, blood                        pressure, adequacy of pulmonary ventilation, and                        response to care were monitored throughout the                        procedure. The physical status of the patient was                        re-assessed after the procedure.                       After obtaining informed consent, the endoscope was                        passed under direct vision. Throughout the procedure,                        the patient's blood pressure, pulse, and oxygen                        saturations were monitored continuously. The Endoscope                        was introduced through the mouth, and advanced to the                        second part of duodenum. The upper GI endoscopy was                        accomplished without difficulty. The patient tolerated  the procedure well. Findings:      The duodenal bulb and second portion of the duodenum were normal.       Biopsies for histology were taken with a cold forceps for evaluation of       celiac disease.      The entire examined stomach was normal. Biopsies were taken with a cold       forceps for Helicobacter pylori testing.      A few very small sessile polyps with no bleeding and no stigmata of       recent bleeding were found in the gastric body consistent with       hyperplastic polyps.      A small  hiatal hernia was present.      Esophagogastric landmarks were identified: the gastroesophageal junction       was found at 38 cm from the incisors.      The gastroesophageal junction and examined esophagus were normal. Impression:           - Normal duodenal bulb and second portion of the                        duodenum. Biopsied.                       - Normal stomach. Biopsied.                       - A few gastric polyps.                       - Small hiatal hernia.                       - Esophagogastric landmarks identified.                       - Normal gastroesophageal junction and esophagus. Recommendation:       - Await pathology results.                       - Follow an antireflux regimen.                       - Proceed with colonoscopy as scheduled                       See colonoscopy report Procedure Code(s):    --- Professional ---                       [id number], Esophagogastroduodenoscopy, flexible, transoral;                        with biopsy, single or multiple Diagnosis Code(s):    --- Professional ---                       K31.7, Polyp of stomach and duodenum                       K44.9, Diaphragmatic hernia without obstruction or                        gangrene  R10.13, Epigastric pain                       R12, Heartburn CPT copyright 2019 American Medical Association. All rights reserved. The codes documented in this report are preliminary and upon coder review may  be revised to meet current compliance requirements. Dr. Ulyess Mort Lin Landsman MD, MD 06/28/2019 9:46:18 AM This report has been signed electronically. Number of Addenda: 0 Note Initiated On: 06/28/2019 9:30 AM Estimated Blood Loss: Estimated blood loss: none.      [facility]

## 2019-06-28 NOTE — H&P (Signed)
Danielle Darby, MD [address]  [place]  [place], [place] [address]  Main: [phone]  Fax: [phone] Pager: [phone]  Primary Care Physician:  Danielle Love, CNM Primary Gastroenterologist:  Dr. Cephas Love  Pre-Procedure History & Physical: HPI:  Danielle Love is a 43 y.o. female is here for an endoscopy and colonoscopy.   Past Medical History:  Diagnosis Date  . Constipation   . Fibrocystic breast changes 2019   Birads 2 mammogram  . Hemorrhoids   . Left ureteral stone 2019    Past Surgical History:  Procedure Laterality Date  . HAND SURGERY     as a child    Prior to Admission medications   Not on File    Allergies as of 06/06/2019  . (No Known Allergies)    Family History  Problem Relation Age of Onset  . Thyroid disease Mother   . Osteoporosis Mother   . Diabetes Father   . Hyperlipidemia Father   . Esophageal cancer Father 77  . Hypertension Father   . Breast cancer Neg Hx     Social History   Socioeconomic History  . Marital status: Married    Spouse name: Not on file  . Number of children: 2  . Years of education: Not on file  . Highest education level: Master's degree (e.g., MA, MS, MEng, MEd, MSW, MBA)  Occupational History  . Occupation: Economist  Social Needs  . Financial resource strain: Not on file  . Food insecurity    Worry: Not on file    Inability: Not on file  . Transportation needs    Medical: Not on file    Non-medical: Not on file  Tobacco Use  . Smoking status: Never Smoker  . Smokeless tobacco: Never Used  Substance and Sexual Activity  . Alcohol use: Never    Frequency: Never  . Drug use: Never  . Sexual activity: Yes    Birth control/protection: None, Other-see comments    Comment: vasectomy  Lifestyle  . Physical activity    Days per week: 4 days    Minutes per session: 40 min  . Stress: Not at all  Relationships  . Social Herbalist on phone: Not on  file    Gets together: Not on file    Attends religious service: Not on file    Active member of club or organization: Not on file    Attends meetings of clubs or organizations: Not on file    Relationship status: Not on file  . Intimate partner violence    Fear of current or ex partner: Not on file    Emotionally abused: Not on file    Physically abused: Not on file    Forced sexual activity: Not on file  Other Topics Concern  . Not on file  Social History Narrative  . Not on file    Review of Systems: See HPI, otherwise negative ROS  Physical Exam: BP 121/76   Pulse 85   Temp (!) 96.3 F (35.7 C) (Tympanic)   Resp 16   Ht 5\' 9"  (1.753 m)   Wt 74.8 kg   LMP 06/17/2019 (Approximate) Comment: neg preg terst 06/28/19  SpO2 99%   BMI 24.37 kg/m  General:   Alert,  pleasant and cooperative in NAD Head:  Normocephalic and atraumatic. Neck:  Supple; no masses or thyromegaly. Lungs:  Clear throughout to auscultation.    Heart:  Regular rate and  rhythm. Abdomen:  Soft, nontender and nondistended. Normal bowel sounds, without guarding, and without rebound.   Neurologic:  Alert and  oriented x4;  grossly normal neurologically.  Impression/Plan: Danielle Love is here for an endoscopy and colonoscopy to be performed for epigastric pain, heart burn, rectal bleeding  Risks, benefits, limitations, and alternatives regarding  endoscopy and colonoscopy have been reviewed with the patient.  Questions have been answered.  All parties agreeable.   Lannette Donath, MD  06/28/2019, 9:25 AM

## 2019-06-28 NOTE — Op Note (Signed)
[facility] Patient Name: Danielle Love Procedure Date: 06/28/2019 9:29 AM MRN: [id number] Account #: [id number] Date of Birth: 1976-09-24 Admit Type: Outpatient Age: 43 Room: [facility] ENDO ROOM 1 Gender: Female Note Status: Finalized Procedure:            Colonoscopy Indications:          Rectal bleeding Providers:            Lin Landsman MD, MD Referring MD:         [name] (Referring MD) Medicines:            Monitored Anesthesia Care Complications:        No immediate complications. Estimated blood loss: None. Procedure:            Pre-Anesthesia Assessment:                       - Prior to the procedure, a History and Physical was                        performed, and patient medications and allergies were                        reviewed. The patient is competent. The risks and                        benefits of the procedure and the sedation options and                        risks were discussed with the patient. All questions                        were answered and informed consent was obtained.                        Patient identification and proposed procedure were                        verified by the physician, the nurse, the                        anesthesiologist, the anesthetist and the technician in                        the pre-procedure area in the procedure room in the                        endoscopy suite. Mental Status Examination: alert and                        oriented. Airway Examination: normal oropharyngeal                        airway and neck mobility. Respiratory Examination:                        clear to auscultation. CV Examination: normal.                        Prophylactic Antibiotics: The patient does not require  prophylactic antibiotics. Prior Anticoagulants: The                        patient has taken no previous anticoagulant or                        antiplatelet  agents. ASA Grade Assessment: I - A                        normal, healthy patient. After reviewing the risks and                        benefits, the patient was deemed in satisfactory                        condition to undergo the procedure. The anesthesia plan                        was to use monitored anesthesia care (MAC). Immediately                        prior to administration of medications, the patient was                        re-assessed for adequacy to receive sedatives. The                        heart rate, respiratory rate, oxygen saturations, blood                        pressure, adequacy of pulmonary ventilation, and                        response to care were monitored throughout the                        procedure. The physical status of the patient was                        re-assessed after the procedure.                       After obtaining informed consent, the colonoscope was                        passed under direct vision. Throughout the procedure,                        the patient's blood pressure, pulse, and oxygen                        saturations were monitored continuously. The                        Colonoscope was introduced through the anus and                        advanced to the the cecum, identified by appendiceal                        orifice  and ileocecal valve. The colonoscopy was                        performed without difficulty. The patient tolerated the                        procedure well. The quality of the bowel preparation                        was fair. Findings:      The perianal and digital rectal examinations were normal. Pertinent       negatives include normal sphincter tone and no palpable rectal lesions.      The entire examined colon appeared normal.      Non-bleeding external hemorrhoids were found during retroflexion. The       hemorrhoids were small.      Copious quantities of semi-liquid stool was found in the  entire colon,       precluding visualization. Lavage of the area was performed using 50 -       200 mL of sterile water, resulting in clearance with good visualization. Impression:           - Preparation of the colon was fair.                       - The entire examined colon is normal.                       - Non-bleeding external hemorrhoids.                       - Stool in the entire examined colon.                       - No specimens collected. Recommendation:       - Discharge patient to home (with escort).                       - Resume previous diet today.                       - Continue present medications.                       - Repeat colonoscopy for screening purposes at age 40. Procedure Code(s):    --- Professional ---                       [phone], Colonoscopy, flexible; diagnostic, including                        collection of specimen(s) by brushing or washing, when                        performed (separate procedure) Diagnosis Code(s):    --- Professional ---                       K64.4, Residual hemorrhoidal skin tags                       K62.5, Hemorrhage of anus and rectum CPT copyright 2019 American Medical Association. All rights reserved. The codes documented  in this report are preliminary and upon coder review may  be revised to meet current compliance requirements. Dr. Ulyess Mort Lin Landsman MD, MD 06/28/2019 10:05:32 AM This report has been signed electronically. Number of Addenda: 0 Note Initiated On: 06/28/2019 9:29 AM Scope Withdrawal Time: 0 hours 10 minutes 35 seconds  Total Procedure Duration: 0 hours 14 minutes 52 seconds  Estimated Blood Loss: Estimated blood loss: none.      [place]

## 2019-06-28 NOTE — Anesthesia Preprocedure Evaluation (Signed)
Anesthesia Evaluation  Patient identified by MRN, date of birth, ID band Patient awake    Reviewed: Allergy & Precautions, NPO status , Patient's Chart, lab work & pertinent test results  History of Anesthesia Complications Negative for: history of anesthetic complications  Airway Mallampati: II  TM Distance: >3 FB Neck ROM: Full    Dental no notable dental hx.    Pulmonary neg pulmonary ROS, neg sleep apnea, neg COPD,    breath sounds clear to auscultation- rhonchi (-) wheezing      Cardiovascular Exercise Tolerance: Good (-) hypertension(-) CAD and (-) Past MI  Rhythm:Regular Rate:Normal - Systolic murmurs and - Diastolic murmurs    Neuro/Psych negative neurological ROS  negative psych ROS   GI/Hepatic negative GI ROS, Neg liver ROS,   Endo/Other  negative endocrine ROSneg diabetes  Renal/GU negative Renal ROS     Musculoskeletal negative musculoskeletal ROS (+)   Abdominal (+) - obese,   Peds  Hematology negative hematology ROS (+)   Anesthesia Other Findings   Reproductive/Obstetrics                             Anesthesia Physical Anesthesia Plan  ASA: I  Anesthesia Plan: General   Post-op Pain Management:    Induction: Intravenous  PONV Risk Score and Plan: 2 and Propofol infusion  Airway Management Planned: Natural Airway  Additional Equipment:   Intra-op Plan:   Post-operative Plan:   Informed Consent: I have reviewed the patients History and Physical, chart, labs and discussed the procedure including the risks, benefits and alternatives for the proposed anesthesia with the patient or authorized representative who has indicated his/her understanding and acceptance.     Dental advisory given  Plan Discussed with: CRNA and Anesthesiologist  Anesthesia Plan Comments:         Anesthesia Quick Evaluation  

## 2019-06-28 NOTE — Anesthesia Post-op Follow-up Note (Signed)
Anesthesia QCDR form completed.        

## 2019-06-28 NOTE — Anesthesia Postprocedure Evaluation (Signed)
Anesthesia Post Note  Patient: Carsen G Deats  Procedure(s) Performed: ESOPHAGOGASTRODUODENOSCOPY (EGD) WITH PROPOFOL (N/A ) COLONOSCOPY WITH PROPOFOL (N/A )  Patient location during evaluation: Endoscopy Anesthesia Type: General Level of consciousness: awake and alert and oriented Pain management: pain level controlled Vital Signs Assessment: post-procedure vital signs reviewed and stable Respiratory status: spontaneous breathing, nonlabored ventilation and respiratory function stable Cardiovascular status: blood pressure returned to baseline and stable Postop Assessment: no signs of nausea or vomiting Anesthetic complications: no     Last Vitals:  Vitals:   06/28/19 1007 06/28/19 1017  BP: 105/71 114/67  Pulse: 91 78  Resp: 18 12  Temp: (!) 36.2 C   SpO2: 100% 100%    Last Pain:  Vitals:   06/28/19 1017  TempSrc:   PainSc: 0-No pain                 Kota Ciancio

## 2019-07-01 ENCOUNTER — Encounter: Payer: Self-pay | Admitting: Gastroenterology

## 2019-07-01 LAB — SURGICAL PATHOLOGY

## 2019-07-02 ENCOUNTER — Ambulatory Visit: Payer: BLUE CROSS/BLUE SHIELD | Admitting: Gastroenterology

## 2019-09-02 DIAGNOSIS — U071 COVID-19: Secondary | ICD-10-CM | POA: Diagnosis not present

## 2019-09-02 DIAGNOSIS — R0989 Other specified symptoms and signs involving the circulatory and respiratory systems: Secondary | ICD-10-CM | POA: Diagnosis not present

## 2019-09-03 ENCOUNTER — Ambulatory Visit: Payer: BLUE CROSS/BLUE SHIELD | Admitting: Gastroenterology

## 2019-09-17 ENCOUNTER — Ambulatory Visit (INDEPENDENT_AMBULATORY_CARE_PROVIDER_SITE_OTHER)
Admission: RE | Admit: 2019-09-17 | Discharge: 2019-09-17 | Disposition: A | Discharge status: Home / Self Care | Payer: BLUE CROSS/BLUE SHIELD | Source: Ambulatory Visit

## 2019-09-17 DIAGNOSIS — H00012 Hordeolum externum right lower eyelid: Secondary | ICD-10-CM | POA: Diagnosis not present

## 2019-09-17 MED ORDER — POLYMYXIN B-TRIMETHOPRIM 10000-0.1 UNIT/ML-% OP SOLN: 1.0000 [drp] | Freq: Four times a day (QID) | OPHTHALMIC | 0 refills | Status: AC

## 2019-09-17 NOTE — ED Provider Notes (Signed)
Virtual Visit via Video Note:  Danielle Love  initiated request for Telemedicine visit with Danielle Love. I connected with Danielle Love  on 09/17/2019 at 11:07 AM  for a synchronized telemedicine visit using a video enabled HIPPA compliant telemedicine application. I verified that I am speaking with Danielle Love  using two identifiers. Danielle Balloon, NP  was physically located in a [facility] Urgent care site and Danielle Love was located at a different location.   The limitations of evaluation and management by telemedicine as well as the availability of in-person appointments were discussed. Patient was informed that she  may incur a bill ( including co-pay) for this virtual visit encounter. Danielle Love  expressed understanding and gave verbal consent to proceed with virtual visit.     History of Present Illness:Danielle Love  is a 43 y.o. female presents for evaluation of 2 day history of right eye stye on lower lid.  She denies acute eye pain, changes in vision, or eye drainage.  She has been treating her symptoms with warm compresses to her eyelid.  She denies fever, chills, ear pain, sore throat, cough, congestion, or other symptoms.  She reports she was COVID positive on 09/02/2019.   No Known Allergies   Past Medical History:  Diagnosis Date  . Constipation   . Fibrocystic breast changes 2019   Birads 2 mammogram  . Hemorrhoids   . Left ureteral stone 2019     Social History   Tobacco Use  . Smoking status: Never Smoker  . Smokeless tobacco: Never Used  Substance Use Topics  . Alcohol use: Never  . Drug use: Never        Observations/Objective: Physical Exam  VITALS: Patient denies fever. GENERAL: Alert, appears well and in no acute distress. HEENT: Atraumatic. NECK: Normal movements of the head and neck. CARDIOPULMONARY: No increased WOB. Speaking in clear sentences. I:E ratio WNL.  MS: Moves all visible extremities without noticeable  abnormality. PSYCH: Pleasant and cooperative, well-groomed. Speech normal rate and rhythm. Affect is appropriate. Insight and judgement are appropriate. Attention is focused, linear, and appropriate.  NEURO: CN grossly intact. Oriented as arrived to appointment on time with no prompting. Moves both UE equally.  SKIN: No obvious lesions, wounds, erythema, or cyanosis noted on face or hands.   Assessment and Plan:    ICD-10-CM   1. Hordeolum externum of right lower eyelid  H00.012        Follow Up Instructions: Instructed patient to use warm compresses on her right lower eyelid.  Instructed her to use the Polytrim eyedrops as directed.  Instructed her to follow-up with her eye care provider if her symptoms are not improving.  Instructed her to go to the emergency department if she has acute eye pain or changes in her vision.  Patient agrees to plan of care.      I discussed the assessment and treatment plan with the patient. The patient was provided an opportunity to ask questions and all were answered. The patient agreed with the plan and demonstrated an understanding of the instructions.   The patient was advised to call back or seek an in-person evaluation if the symptoms worsen or if the condition fails to improve as anticipated.      Danielle Balloon, NP  09/17/2019 11:07 AM         Danielle Balloon, NP 09/17/19 [phone]

## 2019-09-17 NOTE — Discharge Instructions (Addendum)
Use the antibiotic eyedrops as directed.    Follow-up with your eye care provider if your symptoms are not improving.    Go to the emergency department if you have acute eye pain or changes in your vision.

## 2019-10-16 DIAGNOSIS — S92154A Nondisplaced avulsion fracture (chip fracture) of right talus, initial encounter for closed fracture: Secondary | ICD-10-CM | POA: Diagnosis not present

## 2019-11-14 DIAGNOSIS — S92154A Nondisplaced avulsion fracture (chip fracture) of right talus, initial encounter for closed fracture: Secondary | ICD-10-CM | POA: Diagnosis not present

## 2019-11-18 DIAGNOSIS — R112 Nausea with vomiting, unspecified: Secondary | ICD-10-CM | POA: Diagnosis not present

## 2019-11-18 DIAGNOSIS — R42 Dizziness and giddiness: Secondary | ICD-10-CM | POA: Diagnosis not present

## 2019-11-18 DIAGNOSIS — H6980 Other specified disorders of Eustachian tube, unspecified ear: Secondary | ICD-10-CM | POA: Diagnosis not present

## 2019-11-18 DIAGNOSIS — R5383 Other fatigue: Secondary | ICD-10-CM | POA: Diagnosis not present

## 2019-11-21 ENCOUNTER — Telehealth: Payer: Self-pay | Admitting: Gastroenterology

## 2019-11-21 NOTE — Telephone Encounter (Signed)
Patient is calling because she has been dealing for several weeks waves of nausea. Patient states that she went to urgent care earlier this week and they did labs. Everything came back normal. Patient states last night she had a nausea attack when she was at work. Patient states she got really sweaty, dizzy, light headed, tachycardic. She states that they gave her some crackers and a cold rag and 10 minutes later she felt better. Patient states she also has been having some epigastric pain, a lot of gas and burping.

## 2019-11-21 NOTE — Telephone Encounter (Signed)
Patient called & would like to speak with Dr Verdis Prime nurse. She experiencing neause indigestion gas with a lot of burping. Please call [phone]

## 2019-11-22 MED ORDER — OMEPRAZOLE 40 MG PO CPDR: 40.0000 mg | DELAYED_RELEASE_CAPSULE | Freq: Every day | ORAL | 0 refills | Status: DC

## 2019-11-22 NOTE — Telephone Encounter (Signed)
Patient wants to do the 40mg  once a day. Sent medication to the pharmacy. Patient will call in 2 weeks to let know how she is doing

## 2019-11-22 NOTE — Telephone Encounter (Signed)
We can do trial of omeprazole 20mg  BID for 2weeks OTC or prescription strength 40mg  daily Whatever she prefers  RV

## 2019-11-22 NOTE — Addendum Note (Signed)
Addended by: Radene Knee L on: 11/22/2019 11:03 AM   Modules accepted: Orders

## 2019-11-27 DIAGNOSIS — E559 Vitamin D deficiency, unspecified: Secondary | ICD-10-CM | POA: Diagnosis not present

## 2019-11-27 DIAGNOSIS — E519 Thiamine deficiency, unspecified: Secondary | ICD-10-CM | POA: Diagnosis not present

## 2019-11-27 DIAGNOSIS — E611 Iron deficiency: Secondary | ICD-10-CM | POA: Diagnosis not present

## 2019-11-27 DIAGNOSIS — G35 Multiple sclerosis: Secondary | ICD-10-CM | POA: Diagnosis not present

## 2019-11-27 DIAGNOSIS — E612 Magnesium deficiency: Secondary | ICD-10-CM | POA: Diagnosis not present

## 2019-11-27 DIAGNOSIS — R2681 Unsteadiness on feet: Secondary | ICD-10-CM | POA: Diagnosis not present

## 2019-11-27 DIAGNOSIS — Z131 Encounter for screening for diabetes mellitus: Secondary | ICD-10-CM | POA: Diagnosis not present

## 2019-11-27 DIAGNOSIS — R42 Dizziness and giddiness: Secondary | ICD-10-CM | POA: Diagnosis not present

## 2019-11-27 DIAGNOSIS — E538 Deficiency of other specified B group vitamins: Secondary | ICD-10-CM | POA: Diagnosis not present

## 2019-12-13 DIAGNOSIS — Z8616 Personal history of COVID-19: Secondary | ICD-10-CM | POA: Insufficient documentation

## 2019-12-13 DIAGNOSIS — Z0000 Encounter for general adult medical examination without abnormal findings: Secondary | ICD-10-CM | POA: Diagnosis not present

## 2019-12-13 DIAGNOSIS — Z87442 Personal history of urinary calculi: Secondary | ICD-10-CM | POA: Insufficient documentation

## 2019-12-14 ENCOUNTER — Other Ambulatory Visit: Payer: Self-pay | Admitting: Gastroenterology

## 2019-12-16 NOTE — Telephone Encounter (Signed)
Last office visit 05/23/2019 rectal bleeding  Last refill 11/22/2019 0 refills

## 2019-12-18 DIAGNOSIS — G35 Multiple sclerosis: Secondary | ICD-10-CM | POA: Diagnosis not present

## 2019-12-25 ENCOUNTER — Other Ambulatory Visit: Payer: Self-pay | Admitting: Acute Care

## 2019-12-25 DIAGNOSIS — G35 Multiple sclerosis: Secondary | ICD-10-CM

## 2019-12-25 NOTE — Progress Notes (Signed)
Pt notified of appointment for the lumbar puncture. Pt stated that she would be here on 12/31/19 @ 0930.   Pt instructed not to take any anticoagulants, and that she would be here for 4 hours after the procedure and lie flat for 4 hours.  Also, instructed that she needs someone to drive her home.

## 2019-12-30 ENCOUNTER — Other Ambulatory Visit: Payer: Self-pay | Admitting: Gastroenterology

## 2019-12-31 ENCOUNTER — Ambulatory Visit
Admission: RE | Admit: 2019-12-31 | Discharge: 2019-12-31 | Disposition: A | Discharge status: Home / Self Care | Payer: BC Managed Care – PPO | Source: Ambulatory Visit | Attending: Acute Care | Admitting: Acute Care

## 2019-12-31 ENCOUNTER — Other Ambulatory Visit: Payer: Self-pay | Admitting: Internal Medicine

## 2019-12-31 ENCOUNTER — Other Ambulatory Visit: Payer: Self-pay

## 2019-12-31 DIAGNOSIS — G35 Multiple sclerosis: Secondary | ICD-10-CM | POA: Diagnosis not present

## 2019-12-31 HISTORY — DX: Personal history of urinary calculi: Z87.442

## 2019-12-31 LAB — CSF CELL COUNT WITH DIFFERENTIAL
Eosinophils, CSF: 0 %
Lymphs, CSF: 86 %
Monocyte-Macrophage-Spinal Fluid: 14 %
RBC Count, CSF: 0 /mm3 (ref 0–3)
Segmented Neutrophils-CSF: 0 %
Tube #: 3
WBC, CSF: 23 /mm3 (ref 0–5)

## 2019-12-31 LAB — PATHOLOGIST SMEAR REVIEW

## 2019-12-31 LAB — APTT: aPTT: 34 seconds (ref 24–36)

## 2019-12-31 LAB — CBC
HCT: 40.4 % (ref 36.0–46.0)
Hemoglobin: 13.4 g/dL (ref 12.0–15.0)
MCH: 29.6 pg (ref 26.0–34.0)
MCHC: 33.2 g/dL (ref 30.0–36.0)
MCV: 89.4 fL (ref 80.0–100.0)
Platelets: 313 10*3/uL (ref 150–400)
RBC: 4.52 MIL/uL (ref 3.87–5.11)
RDW: 12.5 % (ref 11.5–15.5)
WBC: 5.4 10*3/uL (ref 4.0–10.5)
nRBC: 0 % (ref 0.0–0.2)

## 2019-12-31 LAB — ALBUMIN: Albumin: 4.3 g/dL (ref 3.5–5.0)

## 2019-12-31 LAB — PROTIME-INR
INR: 1 (ref 0.8–1.2)
Prothrombin Time: 12.6 seconds (ref 11.4–15.2)

## 2019-12-31 LAB — POCT PREGNANCY, URINE: Preg Test, Ur: NEGATIVE

## 2019-12-31 LAB — PROTEIN, CSF: Total  Protein, CSF: 28 mg/dL (ref 15–45)

## 2019-12-31 LAB — GLUCOSE, CSF: Glucose, CSF: 50 mg/dL (ref 40–70)

## 2019-12-31 MED ORDER — ACETAMINOPHEN 500 MG PO TABS: 1000.0000 mg | ORAL_TABLET | Freq: Four times a day (QID) | ORAL | Status: DC | PRN

## 2019-12-31 MED ORDER — LIDOCAINE HCL (PF) 1 % IJ SOLN
5.0000 mL | Freq: Once | INTRAMUSCULAR | Status: AC
  Administered 2019-12-31: 11:00:00 5 mL
  Filled 2019-12-31: qty 5

## 2019-12-31 NOTE — OR Nursing (Signed)
Patient discharged without any complaints with scheduled f/u with [facility] neurology.

## 2019-12-31 NOTE — OR Nursing (Signed)
Dr Malvin Johns and Dot Lanes NP aware of elevated csf WBC. No new orders at this time.

## 2019-12-31 NOTE — OR Nursing (Signed)
Patient returned from radiology, lying flat without complaints.

## 2019-12-31 NOTE — Discharge Instructions (Signed)
Lumbar Puncture °A lumbar puncture, also called a spinal tap, is a procedure that is done to remove a small amount of the fluid that surrounds the brain and spinal cord (cerebrospinal fluid, CSF). The fluid is then examined in the lab. This procedure may be done to: °· Help diagnose various problems, such as meningitis, encephalitis, multiple sclerosis, and other infections. °· Remove fluid and relieve pressure that occurs with certain types of headaches. °· Look for bleeding within the brain and spinal cord areas (central nervous system). °· Place medicine into the spinal fluid. °Tell a health care provider about: °· Any allergies you have. °· All medicines you are taking, including vitamins, herbs, eye drops, creams, and over-the-counter medicines like aspirin or NSAIDs. °· Any problems you or family members have had with anesthetic medicines. °· Any blood disorders you have. °· Any surgeries you have had. °· Any medical conditions you have. °· Whether you are pregnant or may be pregnant. °What are the risks? °Generally, this is a safe procedure. However, problems may occur, including: °· Infection at the insertion site that can spread to the bone or spinal fluid. °· Bleeding. °· Spinal headache. This is a severe headache that occurs when there is a leak of spinal fluid. °· Leakage of CSF. °· Allergic reactions to medicines or dyes. °· Damage to other structures or organs. °What happens before the procedure? °Staying hydrated °Follow instructions from your health care provider about hydration, which may include: °· Up to 2 hours before the procedure - you may continue to drink clear liquids, such as water, clear fruit juice, black coffee, and plain tea. °Eating and drinking restrictions °Follow instructions from your health care provider about eating and drinking. If you will be given a medicine to help you relax (sedative), these instructions may include: °· 8 hours before the procedure - stop eating heavy meals  or foods such as meat, fried foods, or fatty foods. °· 6 hours before the procedure - stop eating light meals or foods, such as toast or cereal. °· 6 hours before the procedure - stop drinking milk or drinks that contain milk. °· 2 hours before the procedure - stop drinking clear liquids. °Medicines °· Ask your health care provider about: °? Changing or stopping your regular medicines. This is especially important if you are taking diabetes medicines or blood thinners. °? Taking medicines such as aspirin and ibuprofen. These medicines can thin your blood. Do not take these medicines before your procedure if your health care provider instructs you not to. °· You may be given antibiotic medicine to help prevent infection. If so, take the antibiotic as told by your health care provider. °General instructions °· You may have a blood sample taken. °· You may be asked to shower with a germ-killing soap. °· Ask your health care provider how your surgical site will be marked or identified. °· Plan to have someone take you home from the hospital or clinic. This is especially important if you will be given a sedative. °· If you will be going home right after the procedure, plan to have someone with you for 24 hours. °What happens during the procedure? ° °· You may lie down on your side with your knees bent, or you may sit with your head resting on a pillow on your lap. °? How you are positioned depends on your age and size. °? You will be positioned so that the spaces between the bones of the spine (vertebrae) are as wide   as possible. This will make it easier to pass the needle into the spinal canal. °· The skin on your lower back (lumbar region) will be cleaned. °· You will be given an injection of medicine to numb your lower back area (local anesthetic). °· You may be given pain medicine or a sedative. °· A small needle will be inserted into your lower back until it enters the space that contains the cerebrospinal fluid.  The needle will not enter the spinal cord. °· Fluid will be collected into tubes. It will be sent to a lab for examination. °· The needle will be withdrawn, and a bandage (dressing) will be placed over the area. °The procedure may vary among health care providers and hospitals. °What happens after the procedure? °· Your blood pressure, heart rate, breathing rate, and blood oxygen level will be monitored until any medicines you were given have worn off. °· You may need to stay lying down for a while. °· You will need to drink plenty of fluids and caffeine to help prevent a headache. °· Do not drive for 24 hours if you received a sedative. °Summary °· A lumbar puncture, also called a spinal tap, is a procedure that is done to remove a small amount of the cerebrospinal fluid, CSF. This may be done to help diagnose a wide variety of conditions. °· Before the procedure, tell your health care provider about all medicines you are taking, including vitamins, herbs, eye drops, creams, and over-the-counter medicines. °· Before the procedure, ask your health care provider about changing or stopping your regular medicines. This is especially important if you are taking blood thinners. °· Your lower back will be numbed with an injection before the needle is placed into your spinal canal. °· After the procedure, you will lie down for a while and you will drink plenty of fluids. °This information is not intended to replace advice given to you by your health care provider. Make sure you discuss any questions you have with your health care provider. °Document Revised: 10/26/2016 Document Reviewed: 10/26/2016 °Elsevier Patient Education © 2020 [employer] ° °

## 2019-12-31 NOTE — OR Nursing (Signed)
Transported to Radiology for procedure.

## 2020-01-01 LAB — IGG CSF INDEX
Albumin CSF-mCnc: 22 mg/dL (ref 11–48)
Albumin: 4.4 g/dL (ref 3.8–4.8)
CSF IgG Index: 0.4 (ref 0.0–0.7)
IgG (Immunoglobin G), Serum: 1143 mg/dL (ref 586–1602)
IgG, CSF: 2.3 mg/dL (ref 0.0–8.6)
IgG/Alb Ratio, CSF: 0.1 (ref 0.00–0.25)

## 2020-01-02 LAB — OLIGOCLONAL BANDS, CSF + SERM

## 2020-01-03 LAB — CSF CULTURE W GRAM STAIN
Culture: NO GROWTH
Gram Stain: NONE SEEN

## 2020-01-03 LAB — HSV(HERPES SMPLX VRS)ABS-I+II(IGG)-CSF: HSV Type I/II Ab, IgG CSF: 0.64 IV (ref <=0.89)

## 2020-01-09 DIAGNOSIS — R42 Dizziness and giddiness: Secondary | ICD-10-CM | POA: Insufficient documentation

## 2020-01-09 DIAGNOSIS — R2681 Unsteadiness on feet: Secondary | ICD-10-CM | POA: Diagnosis not present

## 2020-02-10 DIAGNOSIS — L821 Other seborrheic keratosis: Secondary | ICD-10-CM | POA: Diagnosis not present

## 2020-02-10 DIAGNOSIS — L811 Chloasma: Secondary | ICD-10-CM | POA: Diagnosis not present

## 2020-02-10 DIAGNOSIS — D2372 Other benign neoplasm of skin of left lower limb, including hip: Secondary | ICD-10-CM | POA: Diagnosis not present

## 2020-02-15 ENCOUNTER — Emergency Department: Payer: BC Managed Care – PPO

## 2020-02-15 ENCOUNTER — Encounter: Payer: Self-pay | Admitting: Emergency Medicine

## 2020-02-15 ENCOUNTER — Emergency Department
Admission: EM | Admit: 2020-02-15 | Discharge: 2020-02-15 | Disposition: A | Discharge status: Home / Self Care | Payer: BC Managed Care – PPO | Attending: Emergency Medicine | Admitting: Emergency Medicine

## 2020-02-15 ENCOUNTER — Other Ambulatory Visit: Payer: Self-pay

## 2020-02-15 DIAGNOSIS — R079 Chest pain, unspecified: Secondary | ICD-10-CM | POA: Insufficient documentation

## 2020-02-15 DIAGNOSIS — Z5321 Procedure and treatment not carried out due to patient leaving prior to being seen by health care provider: Secondary | ICD-10-CM | POA: Insufficient documentation

## 2020-02-15 LAB — CBC
HCT: 37.9 % (ref 36.0–46.0)
Hemoglobin: 12.4 g/dL (ref 12.0–15.0)
MCH: 28.9 pg (ref 26.0–34.0)
MCHC: 32.7 g/dL (ref 30.0–36.0)
MCV: 88.3 fL (ref 80.0–100.0)
Platelets: 253 10*3/uL (ref 150–400)
RBC: 4.29 MIL/uL (ref 3.87–5.11)
RDW: 12.8 % (ref 11.5–15.5)
WBC: 4.7 10*3/uL (ref 4.0–10.5)
nRBC: 0 % (ref 0.0–0.2)

## 2020-02-15 LAB — BASIC METABOLIC PANEL
Anion gap: 8 (ref 5–15)
BUN: 12 mg/dL (ref 6–20)
CO2: 23 mmol/L (ref 22–32)
Calcium: 9 mg/dL (ref 8.9–10.3)
Chloride: 106 mmol/L (ref 98–111)
Creatinine, Ser: 0.65 mg/dL (ref 0.44–1.00)
GFR calc Af Amer: >60 mL/min (ref >60)
GFR calc non Af Amer: >60 mL/min (ref >60)
Glucose, Bld: 131 mg/dL — ABNORMAL HIGH (ref 70–99)
Potassium: 4.1 mmol/L (ref 3.5–5.1)
Sodium: 137 mmol/L (ref 135–145)

## 2020-02-15 LAB — TROPONIN I (HIGH SENSITIVITY)
Troponin I (High Sensitivity): <2 ng/L (ref <18)
Troponin I (High Sensitivity): 3 ng/L (ref <18)

## 2020-02-15 NOTE — ED Notes (Signed)
Pt up to desk to inquire about wait time.  Explained process as well as plan to repeat troponin at 1450

## 2020-02-15 NOTE — ED Notes (Signed)
Discussed with dr Lenard Lance, no CT needed at this time.

## 2020-02-15 NOTE — ED Notes (Addendum)
Pt to inform RN that leaving, has daughter at dance recital alone and would like to leave. Encouraged to return if worse and FU with cardiologist. NAD, alert and oriented.

## 2020-02-15 NOTE — ED Triage Notes (Signed)
First nurse note- here for chest pain and left arm pain/tingling.  Sx since last night.  Pulled for EKG>

## 2020-02-15 NOTE — ED Triage Notes (Signed)
Pt here for left chest pain and left arm pain/tingling. Tingling is all the time, pains are intermittent. Speech clear, no facial droop.  Ambulatory with steady gait.  Currently getting MS work up. No fever or cough. No focal neuro deficits.  Chest pain started yesterday, arm tingling started during night and present when woke. Equal grip.  No drift upper extremities.

## 2020-02-17 ENCOUNTER — Telehealth: Payer: Self-pay | Admitting: Emergency Medicine

## 2020-02-17 NOTE — Telephone Encounter (Signed)
Called patient due to lwot to inquire about condition and follow up plans. She says she will let her pcp know and she is aware of lab results available.

## 2020-04-03 ENCOUNTER — Other Ambulatory Visit: Payer: Self-pay

## 2020-04-03 ENCOUNTER — Ambulatory Visit (INDEPENDENT_AMBULATORY_CARE_PROVIDER_SITE_OTHER): Payer: BC Managed Care – PPO | Admitting: Certified Nurse Midwife

## 2020-04-03 ENCOUNTER — Encounter: Payer: Self-pay | Admitting: Certified Nurse Midwife

## 2020-04-03 VITALS — BP 118/70 | HR 78 | Resp 16 | Ht 69.0 in | Wt 156.4 lb

## 2020-04-03 DIAGNOSIS — Z01419 Encounter for gynecological examination (general) (routine) without abnormal findings: Secondary | ICD-10-CM | POA: Diagnosis not present

## 2020-04-03 DIAGNOSIS — Z1231 Encounter for screening mammogram for malignant neoplasm of breast: Secondary | ICD-10-CM

## 2020-04-03 NOTE — Progress Notes (Signed)
Gynecology Annual Exam  PCP: Farrel Conners, CNM  Chief Complaint:  Chief Complaint  Patient presents with  . Gynecologic Exam    annual exam    History of Present Illness:Danielle Love is a 44 year old married Caucasian/White female , G 2 P 2 0 0 2 , whose last normal menstrual period was 03/15/2020. . She presents today for her annual exam. She denies any significant gynecological problems.  Menses are regular. They are every month and last 5-6 days, with two heavier days,  requiring tampon changes every 2 hours. She denies dysmenorrhea. Since her last visit, she had an EGD and colonoscopy for rectal bleeding/ constipation. Both endoscopies were negative except for hemorrhoids.  Her symptoms are usually controlled by taking Colace. She also had Covid 19 infection December 2020. In this last year she had a fracture in her foot and has had a workup for dizziness.  Her last pap was 03/15/2019 and was NIL/negative.. She is sexually active and vasectomy is her current form of contraception.  She performs breast self-exams monthly. She reports no breast symptoms. Her last mammogram was 03/26/2019 and was Birads 0 for asymmetry in the left breast.  Additional views of the left breast were negative.There is no family history of breast cancer.  There is no family history of colon cancer.  She had a lipid panel 2019 which was normal. The patient doest exercise regularly with her Nordic bike.  Currently gets adequate calcium from her diet.  The patient does not smoke.  She does not drink alcohol.     Review of Systems: Review of Systems  Constitutional: Negative for chills, fever and weight loss.  HENT: Negative for congestion, sinus pain and sore throat.   Eyes: Negative for blurred vision and pain.  Respiratory: Negative for hemoptysis, shortness of breath and wheezing.   Cardiovascular: Negative for chest pain and leg swelling. Positive for palpitations (PVCs) Gastrointestinal:  Negative for abdominal pain, nausea and vomiting; positive for heartburn and constipation  Genitourinary: Negative for dysuria, frequency, hematuria and urgency.  Musculoskeletal: Negative for back pain, joint pain and myalgias.  Skin: Negative for itching and rash.  Neurological: Negative for dizziness, tingling and headaches.  Endo/Heme/Allergies: Negative for environmental allergies and polydipsia. Does not bruise/bleed easily.       Negative for hirsutism   Psychiatric/Behavioral: Negative for depression. The patient is not nervous/anxious and does not have insomnia.     Past Medical History:  Past Medical History:  Diagnosis Date  . Constipation   . Fibrocystic breast changes 2019   Birads 2 mammogram  . Hemorrhoids   . History of kidney stones   . Left ureteral stone 2019    Past Surgical History:  Past Surgical History:  Procedure Laterality Date  . COLONOSCOPY WITH PROPOFOL N/A 06/28/2019   Procedure: COLONOSCOPY WITH PROPOFOL;  Surgeon: Toney Reil, MD;  Location: [facility] ENDOSCOPY;  Service: Gastroenterology;  Laterality: N/A;  . ESOPHAGOGASTRODUODENOSCOPY (EGD) WITH PROPOFOL N/A 06/28/2019   Procedure: ESOPHAGOGASTRODUODENOSCOPY (EGD) WITH PROPOFOL;  Surgeon: Toney Reil, MD;  Location: [facility] ENDOSCOPY;  Service: Gastroenterology;  Laterality: N/A;  . HAND SURGERY     as a child    Family History:  Family History  Problem Relation Age of Onset  . Thyroid disease Mother   . Osteoporosis Mother   . Diabetes Father   . Hyperlipidemia Father   . Esophageal cancer Father 34  . Hypertension Father   . Breast cancer Neg Hx  Social History:  Social History   Socioeconomic History  . Marital status: Married    Spouse name: Not on file  . Number of children: 2  . Years of education: Not on file  . Highest education level: Master's degree (e.g., MA, MS, MEng, MEd, MSW, MBA)  Occupational History  . Occupation: Oncologist  Tobacco  Use  . Smoking status: Never Smoker  . Smokeless tobacco: Never Used  Vaping Use  . Vaping Use: Never used  Substance and Sexual Activity  . Alcohol use: Never  . Drug use: Never  . Sexual activity: Yes    Birth control/protection: None, Other-see comments    Comment: vasectomy  Other Topics Concern  . Not on file  Social History Narrative  . Not on file   Social Determinants of Health   Financial Resource Strain:   . Difficulty of Paying Living Expenses:   Food Insecurity:   . Worried About Programme researcher, broadcasting/film/video in the Last Year:   . Barista in the Last Year:   Transportation Needs:   . Freight forwarder (Medical):   Marland Kitchen Lack of Transportation (Non-Medical):   Physical Activity:   . Days of Exercise per Week:   . Minutes of Exercise per Session:   Stress:   . Feeling of Stress :   Social Connections:   . Frequency of Communication with Friends and Family:   . Frequency of Social Gatherings with Friends and Family:   . Attends Religious Services:   . Active Member of Clubs or Organizations:   . Attends Banker Meetings:   Marland Kitchen Marital Status:   Intimate Partner Violence:   . Fear of Current or Ex-Partner:   . Emotionally Abused:   Marland Kitchen Physically Abused:   . Sexually Abused:     Allergies:  No Known Allergies   Medications:  Current Outpatient Medications:  .  docusate sodium (COLACE) 100 MG capsule, Take 100 mg by mouth 2 (two) times daily., Disp: , Rfl:   Women's multivitamin  Physical Exam Vitals: BP 118/70   Pulse 78   Resp 16   Ht 5\' 9"  (1.753 m)   Wt 156 lb 6.4 oz (70.9 kg)   LMP 03/15/2020   SpO2 98%   BMI 23.10 kg/m   General: WF in NAD HEENT: normocephalic, anicteric Neck: no thyroid enlargement, no palpable nodules, no cervical lymphadenopathy  Pulmonary: No increased work of breathing, CTAB Cardiovascular: RRR, without murmur  Breast: Breast symmetrical, no tenderness, no palpable nodules or masses, no skin or nipple  retraction present, no nipple discharge.  No axillary, infraclavicular or supraclavicular lymphadenopathy. Abdomen: Soft, non-tender, non-distended.  Umbilicus without lesions.  No hepatomegaly or masses palpable. No evidence of hernia. Genitourinary:  External: Normal external female genitalia.  Normal urethral meatus, normal Bartholin's and Skene's glands.    Vagina: Normal vaginal mucosa, no evidence of prolapse.    Cervix: Grossly normal in appearance, no bleeding, non-tender  Uterus: Anteverted, normal size, shape, and consistency, mobile, and non-tender  Adnexa: No adnexal masses, non-tender  Rectal: skin tag at 12 o'clock, not inflamed or bleeding  Lymphatic: no evidence of inguinal lymphadenopathy Extremities: no edema, erythema, or tenderness Neurologic: Grossly intact Psychiatric: mood appropriate, affect full     Assessment: 44 y.o. with normal annual gyn exam Plan:   1) Breast cancer screening - recommend monthly self breast exam and annual screening mammograms. Mammogram was ordered today.  2) Cervical cancer screening - Pap was  not done. Desires testing every 3 years  3) RTO in 1 year and prn.   Farrel Conners, CNM         Tdap past due. Given today IM Left deloitd. Patient tolerated well.

## 2020-05-08 ENCOUNTER — Ambulatory Visit
Admission: RE | Admit: 2020-05-08 | Discharge: 2020-05-08 | Disposition: A | Discharge status: Home / Self Care | Payer: BC Managed Care – PPO | Source: Ambulatory Visit | Attending: Certified Nurse Midwife | Admitting: Certified Nurse Midwife

## 2020-05-08 DIAGNOSIS — Z1231 Encounter for screening mammogram for malignant neoplasm of breast: Secondary | ICD-10-CM | POA: Diagnosis not present

## 2020-05-25 DIAGNOSIS — R42 Dizziness and giddiness: Secondary | ICD-10-CM | POA: Diagnosis not present

## 2020-05-25 DIAGNOSIS — R2681 Unsteadiness on feet: Secondary | ICD-10-CM | POA: Diagnosis not present

## 2020-05-26 ENCOUNTER — Other Ambulatory Visit: Payer: Self-pay

## 2020-05-27 ENCOUNTER — Ambulatory Visit (INDEPENDENT_AMBULATORY_CARE_PROVIDER_SITE_OTHER): Payer: BC Managed Care – PPO | Admitting: Gastroenterology

## 2020-05-27 ENCOUNTER — Encounter: Payer: Self-pay | Admitting: Gastroenterology

## 2020-05-27 VITALS — BP 103/67 | HR 79 | Temp 98.0°F | Ht 69.0 in | Wt 155.0 lb

## 2020-05-27 DIAGNOSIS — R1013 Epigastric pain: Secondary | ICD-10-CM | POA: Diagnosis not present

## 2020-05-27 DIAGNOSIS — K219 Gastro-esophageal reflux disease without esophagitis: Secondary | ICD-10-CM | POA: Diagnosis not present

## 2020-05-27 MED ORDER — OMEPRAZOLE 40 MG PO CPDR
40.0000 mg | DELAYED_RELEASE_CAPSULE | Freq: Two times a day (BID) | ORAL | 3 refills | Status: DC
Start: 2020-05-27 — End: 2021-04-19

## 2020-05-27 NOTE — Progress Notes (Signed)
Danielle Repress, MD [address]  [address]  [place], [place] [address]  Main: [phone]  Fax: [phone]    Gastroenterology Consultation  Referring Provider:     Farrel Conners, CNM Primary Care Physician:  Farrel Conners, CNM Primary Gastroenterologist:  Dr. Arlyss Love Reason for Consultation:      Alternating constipation and diarrhea, epigastric pain, heartburn, rectal bleeding        HPI:   Danielle Love is a 44 y.o. female referred by Dr. Farrel Conners, CNM  for consultation & management of alternating episodes of constipation and diarrhea, rectal bleeding as well as epigastric pain and heartburn  Alternating constipation and diarrhea, rectal bleeding: Patient reports he has been experiencing episodes of constipation and diarrhea for several years which have gotten worse lately and she would like to get this addressed/managed.  He reports having 1 or 2 hard bowel movements per week followed by 2 to 3 days of several nonbloody loose bowel movements.  Patient has not tried any over-the-counter therapies for constipation or diarrhea.  She denies abdominal bloating.  She does have abdominal cramps associated with increased bowel frequency.  She denies any weight loss.  She does notice bright red blood per rectum on wiping and in the toilet particularly during episodes of constipation. She does report severe rectal burning as well as sharp pain during episodes of constipation  Epigastric pain and heartburn: This has been ongoing for the last 2 to 3 months.  Describes it as burning in her chest as well as in the epigastric area.  Associated with nausea, fullness of stomach.  She drinks sweet tea daily.  She denies melena, right upper quadrant pain.  Patient has not seeked any medical attention for her GI symptoms to date.  Follow-up visit 05/27/2020 Danielle Love has history of chronic GERD and epigastric pain which has been worsening lately.  She has been taking  over-the-counter omeprazole 20 mg 2 pills daily.  She reports severe burning in her throat, sore taste in mouth and rush of fluid into her esophagus when she lays down.  She also experiences choking sensation when she has reflux symptoms.  Her symptoms are significantly better on omeprazole. She reports severe burning pain in epigastric region radiating to her shoulder blade.  She has been currently on bland diet and PPI.  She would like to know if her symptoms are related to gallbladder.  She does report eating spicy foods make her symptoms worse.   Since last visit, she underwent EGD and colonoscopy in 06/2019 which was unremarkable She tried Linzess 145 MCG which resulted in diarrhea.  She is currently on Colace 2 pills daily which keeps her bowels regular.  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  Works 3 night shifts per week.  She thinks her life is generally stressful.  Her husband is a Emergency planning/management officer.  She has a 44 year old and 82 year old children  NSAIDs: None  Antiplts/Anticoagulants/Anti thrombotics: None  GI Procedures:  EGD and colonoscopy 06/28/19 - Normal duodenal bulb and second portion of the duodenum. Biopsied. - Normal stomach. Biopsied. - A few gastric polyps. - Small hiatal hernia. - Esophagogastric landmarks identified. - Normal gastroesophageal junction and esophagus.  - Preparation of the colon was fair. - The entire examined colon is normal. - Non-bleeding external hemorrhoids. - Stool in the entire examined colon. - Love  specimens collected.  DIAGNOSIS:  A. DUODENUM; COLD BIOPSY:  - UNREMARKABLE DUODENAL MUCOSA.  - NEGATIVE FOR FEATURES OF CELIAC DISEASE.  - NEGATIVE FOR DYSPLASIA AND MALIGNANCY.   B. STOMACH, RANDOM; COLD BIOPSY:  - GASTRIC OXYNTIC MUCOSA WITH MILD CHRONIC INFLAMMATION.  - NEGATIVE FOR ACTIVE INFLAMMATION AND H PYLORI.  -  NEGATIVE FOR INTESTINAL METAPLASIA, DYSPLASIA, AND MALIGNANCY.   Father esophageal cancer, adenocarcinoma at age 20 Patient denies smoking, occasional alcohol use  Past Medical History:  Diagnosis Date  . Constipation   . Fibrocystic breast changes 2019   Birads 2 mammogram  . Hemorrhoids   . History of kidney stones   . Left ureteral stone 2019    Past Surgical History:  Procedure Laterality Date  . COLONOSCOPY WITH PROPOFOL N/A 06/28/2019   Procedure: COLONOSCOPY WITH PROPOFOL;  Surgeon: Toney Reil, MD;  Location: [facility] ENDOSCOPY;  Service: Gastroenterology;  Laterality: N/A;  . ESOPHAGOGASTRODUODENOSCOPY (EGD) WITH PROPOFOL N/A 06/28/2019   Procedure: ESOPHAGOGASTRODUODENOSCOPY (EGD) WITH PROPOFOL;  Surgeon: Toney Reil, MD;  Location: [facility] ENDOSCOPY;  Service: Gastroenterology;  Laterality: N/A;  . HAND SURGERY     as a child     Current Outpatient Medications:  .  docusate sodium (COLACE) 100 MG capsule, Take 100 mg by mouth 2 (two) times daily., Disp: , Rfl:  .  omeprazole (PRILOSEC) 40 MG capsule, Take 1 capsule (40 mg total) by mouth 2 (two) times daily before a meal., Disp: 60 capsule, Rfl: 3  Family History  Problem Relation Age of Onset  . Thyroid disease Mother   . Osteoporosis Mother   . Diabetes Father   . Hyperlipidemia Father   . Esophageal cancer Father 37  . Hypertension Father   . Breast cancer Neg Hx      Social History   Tobacco Use  . Smoking status: Never Smoker  . Smokeless tobacco: Never Used  Vaping Use  . Vaping Use: Never used  Substance Use Topics  . Alcohol use: Never  . Drug use: Never    Allergies as of 05/27/2020  . (Love Known Allergies)    Review of Systems:    All systems reviewed and negative except where noted in HPI.   Physical Exam:  BP 103/67 (BP Location: Left Arm, Patient Position: Sitting, Cuff Size: Normal)   Pulse 79   Temp 98 F (36.7 C) (Oral)   Ht 5\' 9"  (1.753 m)   Wt 155 lb (70.3 kg)   BMI  22.89 kg/m  Love LMP recorded.  General:   Alert,  Well-developed, well-nourished, pleasant and cooperative in NAD Head:  Normocephalic and atraumatic. Eyes:  Sclera clear, Love icterus.   Conjunctiva pink. Ears:  Normal auditory acuity. Nose:  Love deformity, discharge, or lesions. Mouth:  Love deformity or lesions,oropharynx pink & moist. Neck:  Supple; Love masses or thyromegaly. Lungs:  Respirations even and unlabored.  Clear throughout to auscultation.   Love wheezes, crackles, or rhonchi. Love acute distress. Heart:  Regular rate and rhythm; Love murmurs, clicks, rubs, or gallops. Abdomen:  Normal bowel sounds. Soft, non-tender and non-distended without masses, hepatosplenomegaly or hernias noted.  Love guarding or rebound tenderness.   Rectal: Mild burning tenderness in the posterior wall of the anal canal Msk:  Symmetrical without gross deformities. Good, equal movement & strength bilaterally. Pulses:  Normal pulses noted. Extremities:  Love clubbing or edema.  Love cyanosis. Neurologic:  Alert and oriented x3;  grossly normal neurologically. Skin:  Intact without significant lesions or rashes.  Love jaundice. Psych:  Alert and cooperative. Normal mood and affect.  Imaging Studies: Love abdominal imaging  Assessment and Plan:   Danielle Love is a 44 y.o. female with Love significant past medical history is seen for follow-up of dyspepsia and heartburn  Epigastric pain with radiation to shoulder and heartburn EGD was unremarkable Last normal LFTs in 10/2019 Recommend right upper quadrant ultrasound to evaluate for cholelithiasis Increase omeprazole to 40 mg twice daily before meals   Follow up in 3 months   Danielle Repress, MD

## 2020-05-27 NOTE — Patient Instructions (Addendum)
Your RUQ ultrasound is scheduled for 06/03/2020 [place]rived 9:00am [place] [facility] Nothing to eat or drink after midnight

## 2020-06-03 ENCOUNTER — Ambulatory Visit
Admission: RE | Admit: 2020-06-03 | Discharge: 2020-06-03 | Disposition: A | Discharge status: Home / Self Care | Payer: BC Managed Care – PPO | Source: Ambulatory Visit | Attending: Gastroenterology | Admitting: Gastroenterology

## 2020-06-03 ENCOUNTER — Other Ambulatory Visit: Payer: Self-pay

## 2020-06-03 ENCOUNTER — Encounter: Payer: Self-pay | Admitting: Gastroenterology

## 2020-06-03 DIAGNOSIS — R1013 Epigastric pain: Secondary | ICD-10-CM | POA: Diagnosis not present

## 2020-06-03 DIAGNOSIS — K7689 Other specified diseases of liver: Secondary | ICD-10-CM | POA: Diagnosis not present

## 2020-06-04 DIAGNOSIS — G35 Multiple sclerosis: Secondary | ICD-10-CM | POA: Diagnosis not present

## 2020-06-09 DIAGNOSIS — R0789 Other chest pain: Secondary | ICD-10-CM | POA: Diagnosis not present

## 2020-06-09 DIAGNOSIS — K2100 Gastro-esophageal reflux disease with esophagitis, without bleeding: Secondary | ICD-10-CM | POA: Diagnosis not present

## 2020-06-24 DIAGNOSIS — K644 Residual hemorrhoidal skin tags: Secondary | ICD-10-CM | POA: Diagnosis not present

## 2020-09-01 ENCOUNTER — Ambulatory Visit: Payer: BC Managed Care – PPO | Admitting: Gastroenterology

## 2021-01-04 DIAGNOSIS — Z01818 Encounter for other preprocedural examination: Secondary | ICD-10-CM | POA: Diagnosis not present

## 2021-01-04 DIAGNOSIS — N906 Unspecified hypertrophy of vulva: Secondary | ICD-10-CM | POA: Diagnosis not present

## 2021-01-19 DIAGNOSIS — N906 Unspecified hypertrophy of vulva: Secondary | ICD-10-CM | POA: Diagnosis not present

## 2021-04-11 IMAGING — MG DIGITAL SCREENING BILATERAL MAMMOGRAM WITH TOMO AND CAD
8 series · 9 of 24 positions shown · non-contrast
Comparison: Previous exam(s).

CLINICAL DATA: Screening.

EXAM:
DIGITAL SCREENING BILATERAL MAMMOGRAM WITH TOMO AND CAD

[R MLO synth-2D]
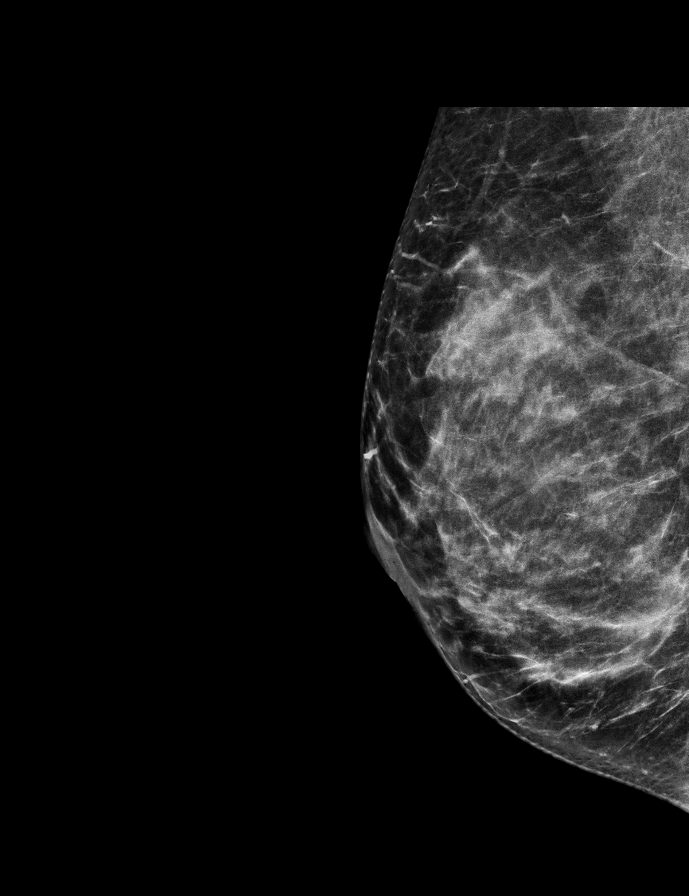

[L CC synth-2D]
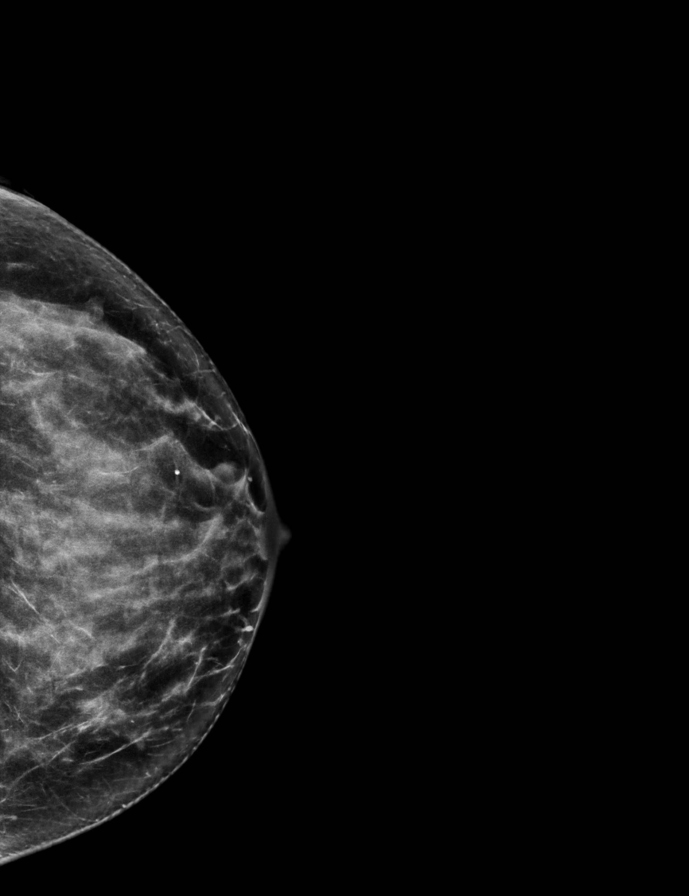

[L MLO synth-2D]
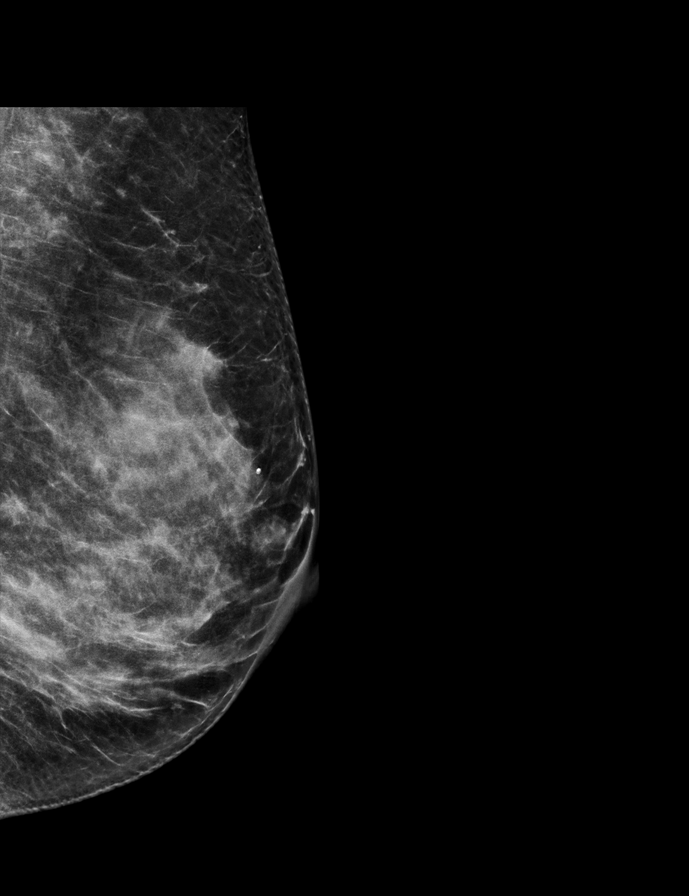

[R CC synth-2D]
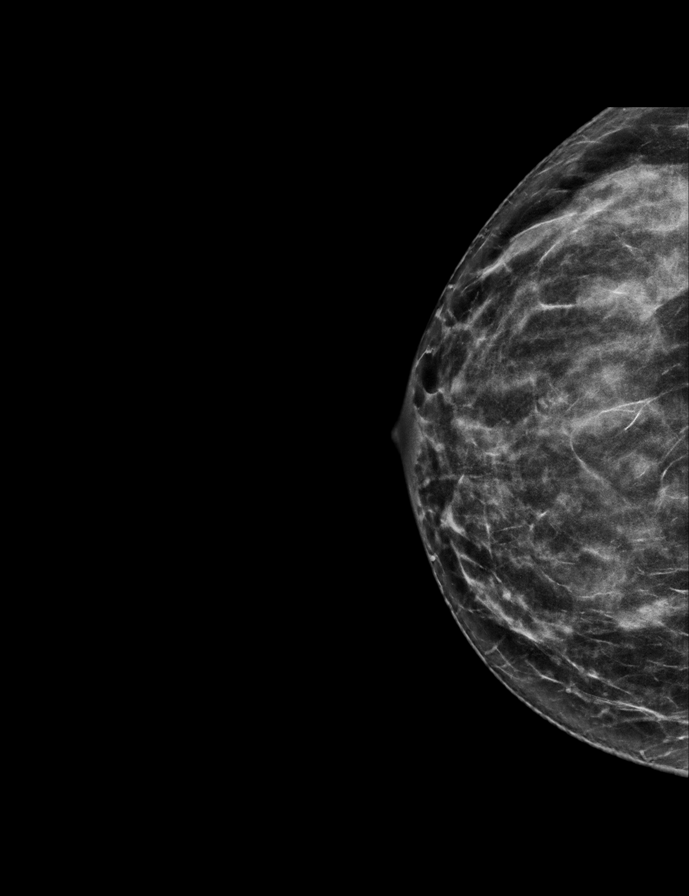

[L CC tomo · 2 of 66 frames shown]
[frame 22/66]
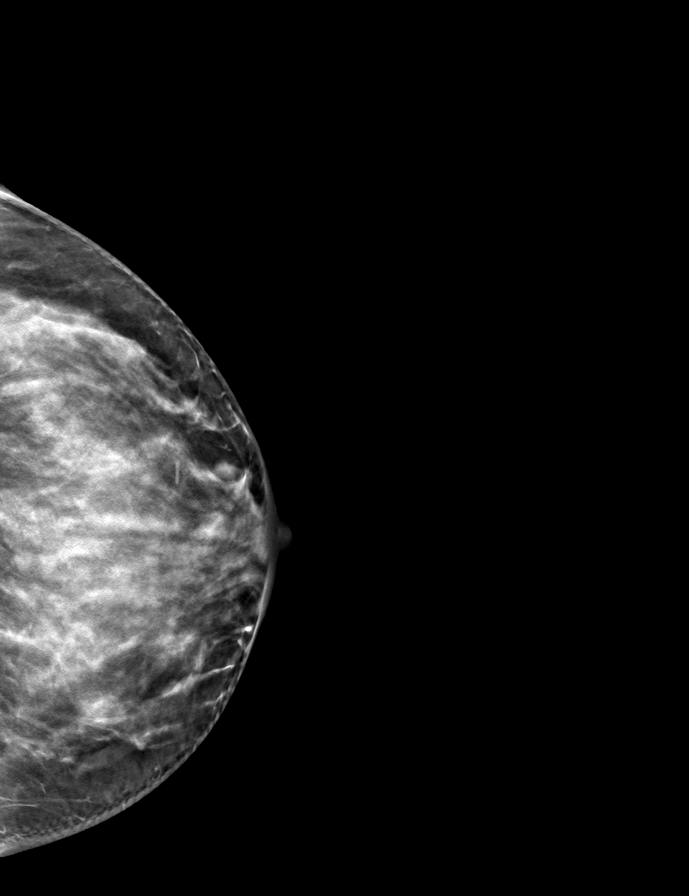
[frame 33/66]
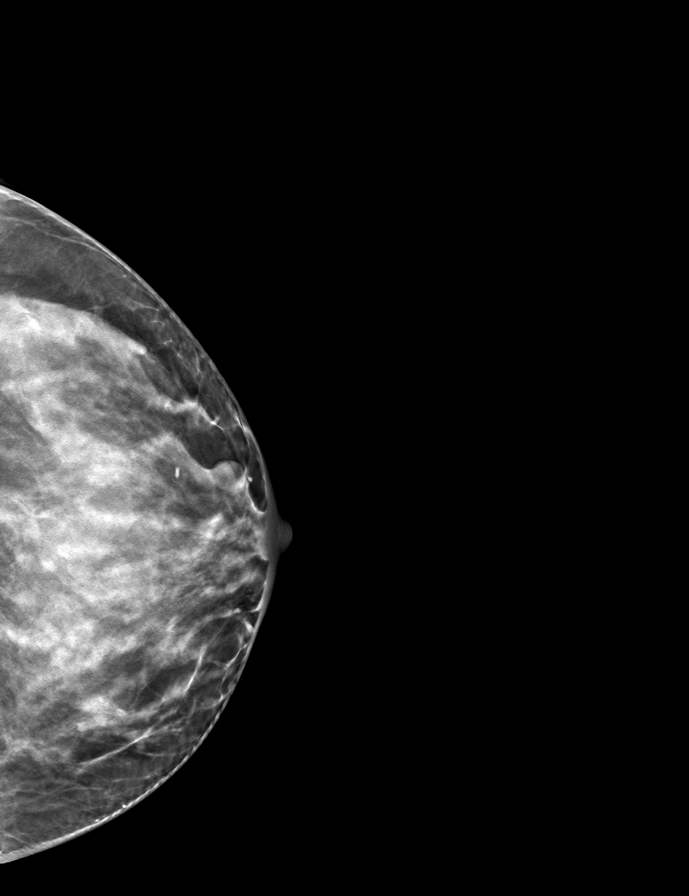

[R CC tomo · tomo slice 32/63.0]
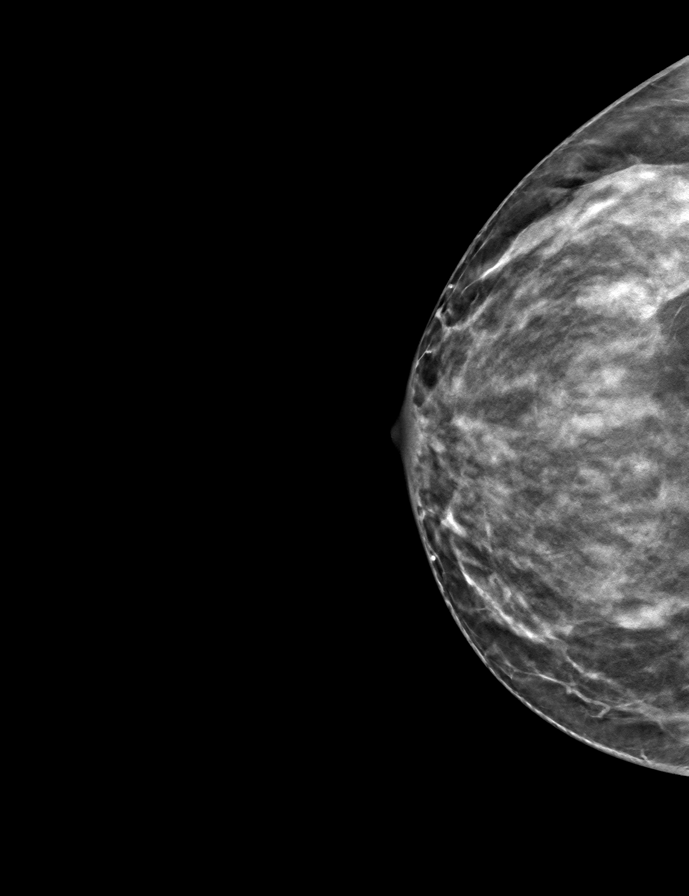

[L MLO tomo · tomo slice 32/63.0]
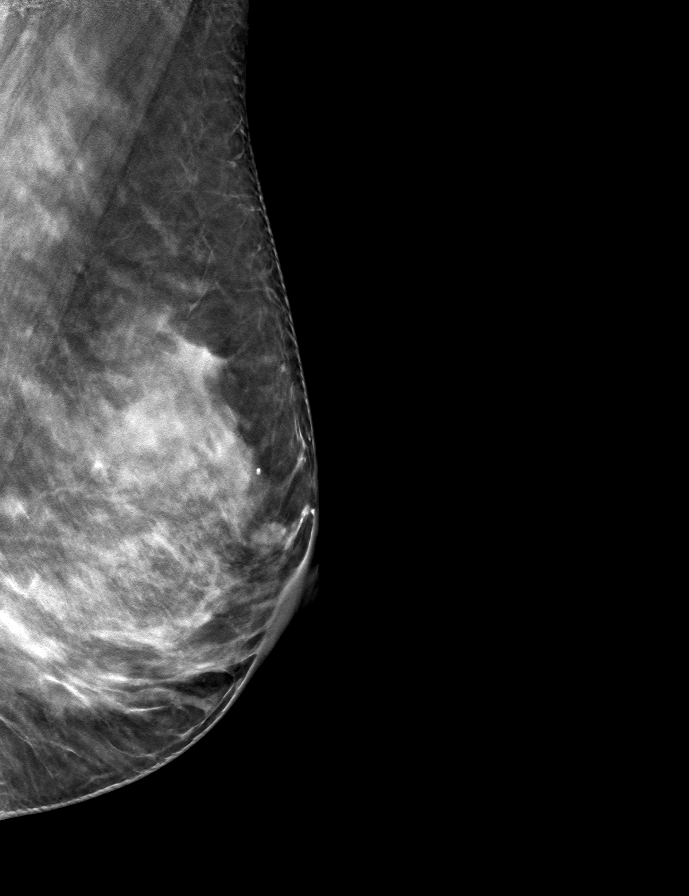

[R MLO tomo · tomo slice 33/65.0]
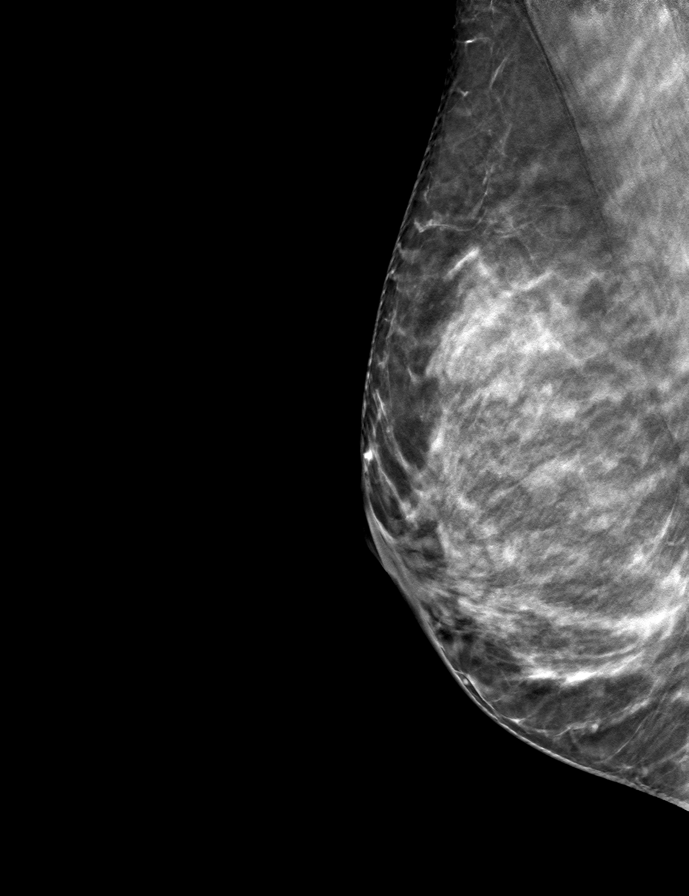

[9 of 24 positions shown; findings below may reference images not displayed]

ACR Breast Density Category c: The breast tissue is heterogeneously
dense, which may obscure small masses.
FINDINGS: In the left breast, a possible asymmetry warrants further
evaluation. In the right breast, no findings suspicious for
malignancy. Images were processed with CAD.
IMPRESSION: Further evaluation is suggested for possible asymmetry in the left
breast.

RECOMMENDATION:
Diagnostic mammogram and possibly ultrasound of the left breast.
(Code:F6-R-881)

The patient will be contacted regarding the findings, and additional
imaging will be scheduled.

BI-RADS CATEGORY  0: Incomplete. Need additional imaging evaluation
and/or prior mammograms for comparison.

## 2021-04-19 ENCOUNTER — Ambulatory Visit (INDEPENDENT_AMBULATORY_CARE_PROVIDER_SITE_OTHER): Payer: BC Managed Care – PPO | Admitting: Obstetrics and Gynecology

## 2021-04-19 ENCOUNTER — Other Ambulatory Visit: Payer: Self-pay

## 2021-04-19 ENCOUNTER — Encounter: Payer: Self-pay | Admitting: Obstetrics and Gynecology

## 2021-04-19 VITALS — BP 100/70 | Ht 69.0 in | Wt 159.0 lb

## 2021-04-19 DIAGNOSIS — Z01419 Encounter for gynecological examination (general) (routine) without abnormal findings: Secondary | ICD-10-CM

## 2021-04-19 DIAGNOSIS — Z1231 Encounter for screening mammogram for malignant neoplasm of breast: Secondary | ICD-10-CM | POA: Diagnosis not present

## 2021-04-19 NOTE — Progress Notes (Signed)
PCP: Farrel Conners, CNM (Inactive)   Chief Complaint  Patient presents with   Gynecologic Exam    No concerns    HPI:      Danielle Love is a 45 y.o. [id number] whose LMP was Patient's last menstrual period was 03/22/2021 (approximate)., presents today for her annual examination.  Her menses are regular every 28-30 days, lasting 6 days.  Menses a little heavier now. Changing products Q1-2 hrs for 2 heavy days. Dysmenorrhea none. She does not have intermenstrual bleeding. She does not have vasomotor sx.   Sex activity: sexually active--contraception vasectomy. She does not have vaginal dryness.  Last Pap: 03/15/19 Results were: no abnormalities /neg HPV DNA.  Hx of STDs: none  Last mammogram: 05/08/20 Results were: normal--routine follow-up in 12 months. There is no FH of breast cancer. There is no FH of ovarian cancer. The patient does do self-breast exams.  Colonoscopy: 2020 WNL except hemorrhoids; Repeat due after ?10 years.   Tobacco use: The patient denies current or previous tobacco use. Alcohol use: none No drug use Exercise: moderately active  She does get adequate calcium and Vitamin D in her diet.  Labs with PCP.  Past Medical History:  Diagnosis Date   Constipation    Fibrocystic breast changes 2019   Birads 2 mammogram   Hemorrhoids    History of kidney stones    Left ureteral stone 2019   Rectal bleeding     Past Surgical History:  Procedure Laterality Date   COLONOSCOPY WITH PROPOFOL N/A 06/28/2019   Procedure: COLONOSCOPY WITH PROPOFOL;  Surgeon: Toney Reil, MD;  Location: [facility] ENDOSCOPY;  Service: Gastroenterology;  Laterality: N/A;   ESOPHAGOGASTRODUODENOSCOPY (EGD) WITH PROPOFOL N/A 06/28/2019   Procedure: ESOPHAGOGASTRODUODENOSCOPY (EGD) WITH PROPOFOL;  Surgeon: Toney Reil, MD;  Location: [facility] ENDOSCOPY;  Service: Gastroenterology;  Laterality: N/A;   HAND SURGERY     as a child    Family History  Problem Relation Age of  Onset   Thyroid disease Mother    Osteoporosis Mother    Diabetes Father    Hyperlipidemia Father    Esophageal cancer Father 106   Hypertension Father    Breast cancer Neg Hx     Social History   Socioeconomic History   Marital status: Married    Spouse name: Not on file   Number of children: 2   Years of education: Not on file   Highest education level: Master's degree (e.g., MA, MS, MEng, MEd, MSW, MBA)  Occupational History   Occupation: Oncologist  Tobacco Use   Smoking status: Never   Smokeless tobacco: Never  Vaping Use   Vaping Use: Never used  Substance and Sexual Activity   Alcohol use: Never   Drug use: Never   Sexual activity: Yes    Birth control/protection: None, Other-see comments    Comment: vasectomy  Other Topics Concern   Not on file  Social History Narrative   Not on file   Social Determinants of Health   Financial Resource Strain: Not on file  Food Insecurity: Not on file  Transportation Needs: Not on file  Physical Activity: Not on file  Stress: Not on file  Social Connections: Not on file  Intimate Partner Violence: Not on file     Current Outpatient Medications:    docusate sodium (COLACE) 100 MG capsule, Take 100 mg by mouth 2 (two) times daily., Disp: , Rfl:    famotidine (PEPCID) 40 MG tablet, Take 40  mg by mouth at bedtime., Disp: , Rfl:    Multiple Vitamin (MULTI-VITAMIN) tablet, Take by mouth., Disp: , Rfl:    RABEprazole (ACIPHEX) 20 MG tablet, Take 20 mg by mouth daily., Disp: , Rfl:      ROS:  Review of Systems  Constitutional:  Negative for fatigue, fever and unexpected weight change.  Respiratory:  Negative for cough, shortness of breath and wheezing.   Cardiovascular:  Negative for chest pain, palpitations and leg swelling.  Gastrointestinal:  Positive for constipation. Negative for blood in stool, diarrhea, nausea and vomiting.  Endocrine: Negative for cold intolerance, heat intolerance and polyuria.   Genitourinary:  Negative for dyspareunia, dysuria, flank pain, frequency, genital sores, hematuria, menstrual problem, pelvic pain, urgency, vaginal bleeding, vaginal discharge and vaginal pain.  Musculoskeletal:  Negative for back pain, joint swelling and myalgias.  Skin:  Negative for rash.  Neurological:  Negative for dizziness, syncope, light-headedness, numbness and headaches.  Hematological:  Negative for adenopathy.  Psychiatric/Behavioral:  Negative for agitation, confusion, sleep disturbance and suicidal ideas. The patient is not nervous/anxious.   BREAST: No symptoms    Objective: BP 100/70   Ht 5\' 9"  (1.753 m)   Wt 159 lb (72.1 kg)   LMP 03/22/2021 (Approximate)   BMI 23.48 kg/m    Physical Exam Constitutional:      Appearance: She is well-developed.  Genitourinary:     Vulva normal.     Right Labia: No rash, tenderness or lesions.    Left Labia: No tenderness, lesions or rash.    No vaginal discharge, erythema or tenderness.      Right Adnexa: not tender and no mass present.    Left Adnexa: not tender and no mass present.    No cervical friability or polyp.     Uterus is not enlarged or tender.  Breasts:    Right: No mass, nipple discharge, skin change or tenderness.     Left: No mass, nipple discharge, skin change or tenderness.  Neck:     Thyroid: No thyromegaly.  Cardiovascular:     Rate and Rhythm: Normal rate and regular rhythm.     Heart sounds: Normal heart sounds. No murmur heard. Pulmonary:     Effort: Pulmonary effort is normal.     Breath sounds: Normal breath sounds.  Abdominal:     Palpations: Abdomen is soft.     Tenderness: There is no abdominal tenderness. There is no guarding or rebound.  Musculoskeletal:        General: Normal range of motion.     Cervical back: Normal range of motion.  Lymphadenopathy:     Cervical: No cervical adenopathy.  Neurological:     General: No focal deficit present.     Mental Status: She is alert and  oriented to person, place, and time.     Cranial Nerves: No cranial nerve deficit.  Skin:    General: Skin is warm and dry.  Psychiatric:        Mood and Affect: Mood normal.        Behavior: Behavior normal.        Thought Content: Thought content normal.        Judgment: Judgment normal.  Vitals reviewed.    Assessment/Plan:  Encounter for annual routine gynecological examination  Encounter for screening mammogram for malignant neoplasm of breast - Plan: MM 3D SCREEN BREAST BILATERAL; pt to sched mammo          GYN counsel breast self exam, mammography  screening, adequate intake of calcium and vitamin D, diet and exercise    F/U  Return in about 1 year (around 04/19/2022).  Ellar Hakala B. Jaima Janney, PA-C 04/19/2021 10:37 AM

## 2021-04-19 NOTE — Patient Instructions (Signed)
I value your feedback and you entrusting us with your care. If you get a [facility] patient survey, I would appreciate you taking the time to let us know about your experience today. Thank you!  [facility]: [phone]      

## 2021-05-25 ENCOUNTER — Ambulatory Visit
Admission: RE | Admit: 2021-05-25 | Discharge: 2021-05-25 | Disposition: A | Discharge status: Home / Self Care | Payer: BC Managed Care – PPO | Source: Ambulatory Visit | Attending: Obstetrics and Gynecology | Admitting: Obstetrics and Gynecology

## 2021-05-25 ENCOUNTER — Other Ambulatory Visit: Payer: Self-pay

## 2021-05-25 DIAGNOSIS — Z1231 Encounter for screening mammogram for malignant neoplasm of breast: Secondary | ICD-10-CM | POA: Insufficient documentation

## 2021-10-21 DIAGNOSIS — R002 Palpitations: Secondary | ICD-10-CM | POA: Diagnosis not present

## 2021-10-21 DIAGNOSIS — I493 Ventricular premature depolarization: Secondary | ICD-10-CM | POA: Diagnosis not present

## 2022-03-07 DIAGNOSIS — D2272 Melanocytic nevi of left lower limb, including hip: Secondary | ICD-10-CM | POA: Diagnosis not present

## 2022-03-07 DIAGNOSIS — D2262 Melanocytic nevi of left upper limb, including shoulder: Secondary | ICD-10-CM | POA: Diagnosis not present

## 2022-03-07 DIAGNOSIS — D225 Melanocytic nevi of trunk: Secondary | ICD-10-CM | POA: Diagnosis not present

## 2022-03-07 DIAGNOSIS — D2261 Melanocytic nevi of right upper limb, including shoulder: Secondary | ICD-10-CM | POA: Diagnosis not present

## 2022-05-02 ENCOUNTER — Other Ambulatory Visit: Payer: Self-pay | Admitting: Obstetrics and Gynecology

## 2022-05-02 DIAGNOSIS — Z1231 Encounter for screening mammogram for malignant neoplasm of breast: Secondary | ICD-10-CM

## 2022-06-01 ENCOUNTER — Ambulatory Visit
Admission: RE | Admit: 2022-06-01 | Discharge: 2022-06-01 | Disposition: A | Discharge status: Home / Self Care | Payer: BC Managed Care – PPO | Source: Ambulatory Visit | Attending: Obstetrics and Gynecology | Admitting: Obstetrics and Gynecology

## 2022-06-01 DIAGNOSIS — Z1231 Encounter for screening mammogram for malignant neoplasm of breast: Secondary | ICD-10-CM | POA: Insufficient documentation

## 2022-06-02 ENCOUNTER — Other Ambulatory Visit: Payer: Self-pay | Admitting: Obstetrics and Gynecology

## 2022-06-02 DIAGNOSIS — N63 Unspecified lump in unspecified breast: Secondary | ICD-10-CM

## 2022-06-02 DIAGNOSIS — R928 Other abnormal and inconclusive findings on diagnostic imaging of breast: Secondary | ICD-10-CM

## 2022-06-06 ENCOUNTER — Other Ambulatory Visit: Payer: Self-pay | Admitting: Family Medicine

## 2022-06-09 ENCOUNTER — Ambulatory Visit
Admission: RE | Admit: 2022-06-09 | Discharge: 2022-06-09 | Disposition: A | Discharge status: Home / Self Care | Payer: BC Managed Care – PPO | Source: Ambulatory Visit | Attending: Obstetrics and Gynecology | Admitting: Obstetrics and Gynecology

## 2022-06-09 DIAGNOSIS — N63 Unspecified lump in unspecified breast: Secondary | ICD-10-CM | POA: Diagnosis not present

## 2022-06-09 DIAGNOSIS — R928 Other abnormal and inconclusive findings on diagnostic imaging of breast: Secondary | ICD-10-CM | POA: Insufficient documentation

## 2022-06-21 ENCOUNTER — Other Ambulatory Visit: Payer: BC Managed Care – PPO

## 2022-09-12 DIAGNOSIS — M5416 Radiculopathy, lumbar region: Secondary | ICD-10-CM | POA: Diagnosis not present

## 2022-09-12 DIAGNOSIS — M76891 Other specified enthesopathies of right lower limb, excluding foot: Secondary | ICD-10-CM | POA: Diagnosis not present

## 2022-10-19 DIAGNOSIS — M25551 Pain in right hip: Secondary | ICD-10-CM | POA: Diagnosis not present

## 2022-11-02 DIAGNOSIS — M25551 Pain in right hip: Secondary | ICD-10-CM | POA: Diagnosis not present

## 2022-11-09 DIAGNOSIS — M25551 Pain in right hip: Secondary | ICD-10-CM | POA: Diagnosis not present

## 2022-11-17 DIAGNOSIS — M25551 Pain in right hip: Secondary | ICD-10-CM | POA: Diagnosis not present

## 2023-04-12 ENCOUNTER — Encounter: Payer: Self-pay | Admitting: Certified Nurse Midwife

## 2023-04-12 ENCOUNTER — Telehealth: Payer: Self-pay

## 2023-04-12 ENCOUNTER — Ambulatory Visit: Payer: BC Managed Care – PPO | Admitting: Certified Nurse Midwife

## 2023-04-12 VITALS — BP 94/60 | HR 80 | Ht 69.0 in | Wt 146.0 lb

## 2023-04-12 DIAGNOSIS — N6312 Unspecified lump in the right breast, upper inner quadrant: Secondary | ICD-10-CM

## 2023-04-12 NOTE — Telephone Encounter (Signed)
Pt called back.  I scheduled her with J.Albrecht at 2:35.  Will schedule her annual exam with [facility] when she comes in for appt this afternoon.

## 2023-04-12 NOTE — Telephone Encounter (Signed)
Pt calling; has a lump in her right breast; had mammogram/u/s some months ago and there was a cyst; states it's a pretty large lump; wants mammogram and u/s again.  [phone]

## 2023-04-12 NOTE — Telephone Encounter (Signed)
I contacted the patient via phone. We have openings this afternoon in the schedule for Breast exam with any provider, then will could we schedule an Annual exam for next available with [facility] for September. I left message for the patient to contact our office so scheduling.

## 2023-04-12 NOTE — Progress Notes (Signed)
Copland, Ilona Sorrel, PA-C   Chief Complaint  Patient presents with   Breast Mass    Size of a grape been there for 3 weeks     HPI:      Danielle Love is a 47 y.o. [id number] whose LMP was Patient's last menstrual period was 03/15/2023., presents today for right breast lump. Danielle Love noted this lump about 3 weeks ago, she also noted dried discharge on nipple at time of noting lump. Reports having cyst noted on last mammogram/ultrasound.     Patient Active Problem List   Diagnosis Date Noted   Dizziness 01/09/2020   Unsteady gait 01/09/2020   History of 2019 novel coronavirus disease (COVID-19) 12/13/2019   History of nephrolithiasis 12/13/2019   Dyspepsia    Fibrocystic breast changes 02/15/2018   Near syncope 09/07/2015    Past Surgical History:  Procedure Laterality Date   COLONOSCOPY WITH PROPOFOL N/A 06/28/2019   Procedure: COLONOSCOPY WITH PROPOFOL;  Surgeon: Toney Reil, MD;  Location: [facility] ENDOSCOPY;  Service: Gastroenterology;  Laterality: N/A;   ESOPHAGOGASTRODUODENOSCOPY (EGD) WITH PROPOFOL N/A 06/28/2019   Procedure: ESOPHAGOGASTRODUODENOSCOPY (EGD) WITH PROPOFOL;  Surgeon: Toney Reil, MD;  Location: [facility] ENDOSCOPY;  Service: Gastroenterology;  Laterality: N/A;   HAND SURGERY     as a child    Family History  Problem Relation Age of Onset   Diabetes Father    Hyperlipidemia Father    Esophageal cancer Father 48   Hypertension Father    Thyroid disease Mother    Osteoporosis Mother    Stroke Mother    Breast cancer Neg Hx     Social History   Socioeconomic History   Marital status: Married    Spouse name: Not on file   Number of children: 2   Years of education: Not on file   Highest education level: Master's degree (e.g., MA, MS, MEng, MEd, MSW, MBA)  Occupational History   Occupation: Oncologist  Tobacco Use   Smoking status: Never   Smokeless tobacco: Never  Vaping Use   Vaping status: Never Used  Substance  and Sexual Activity   Alcohol use: Never   Drug use: Never   Sexual activity: Yes    Birth control/protection: None, Other-see comments    Comment: vasectomy  Other Topics Concern   Not on file  Social History Narrative   Not on file   Social Determinants of Health   Financial Resource Strain: Not on file  Food Insecurity: Not on file  Transportation Needs: Not on file  Physical Activity: Sufficiently Active (03/15/2019)   Exercise Vital Sign    Days of Exercise per Week: 4 days    Minutes of Exercise per Session: 40 min  Stress: No Stress Concern Present (01/10/2018)   [employer] of Occupational Health - Occupational Stress Questionnaire    Feeling of Stress : Not at all  Social Connections: Not on file  Intimate Partner Violence: Not on file    Outpatient Medications Prior to Visit  Medication Sig Dispense Refill   docusate sodium (COLACE) 100 MG capsule Take 100 mg by mouth 2 (two) times daily.     famotidine (PEPCID) 40 MG tablet Take 40 mg by mouth at bedtime.     Multiple Vitamin (MULTI-VITAMIN) tablet Take by mouth.     RABEprazole (ACIPHEX) 20 MG tablet Take 20 mg by mouth daily.     No facility-administered medications prior to visit.      ROS:  Review  of Systems  Constitutional: Negative.   Respiratory: Negative.    Cardiovascular: Negative.   Skin:        Grape size lump to right inner breast     OBJECTIVE:   Vitals:  BP 94/60   Pulse 80   Ht 5\' 9"  (1.753 m)   Wt 146 lb (66.2 kg)   LMP 03/15/2023   BMI 21.56 kg/m   Physical Exam Constitutional:      Appearance: Normal appearance.  HENT:     Head: Normocephalic.  Cardiovascular:     Rate and Rhythm: Normal rate.  Pulmonary:     Effort: Pulmonary effort is normal.  Chest:  Breasts:    Tanner Score is 5.     Right: Mass present. No nipple discharge or tenderness.     Left: Normal.       Comments: Right inner upper quadrant appoximately 1cm from areola mobile, non-tender mass  noted. ~4cm x 1cm. Skin:    General: Skin is warm and dry.  Neurological:     General: No focal deficit present.     Mental Status: She is alert and oriented to person, place, and time.  Psychiatric:        Mood and Affect: Mood normal.        Behavior: Behavior normal.     Results: No results found for this or any previous visit (from the past 24 hour(s)).   Assessment/Plan: Mass of upper inner quadrant of right breast - Plan: MM 3D DIAGNOSTIC MAMMOGRAM BILATERAL BREAST, [place] LIMITED ULTRASOUND INCLUDING AXILLA RIGHT BREAST Mammogram & ultrasound ordered, reviewed that biopsy will likely be recommended. Prior ultrasound & mammogram reviewed, prior exam shows cyst to right breast however this was not palpable previously.   No orders of the defined types were placed in this encounter.    Dominica Severin, CNM 04/12/2023 2:39 PM

## 2023-04-18 NOTE — Telephone Encounter (Signed)
Pt completed appt on 04/12/23 for breast exam with JA.  Was able to schedule her annual exam with [facility] on 06/27/2023 at 10:55.

## 2023-04-27 ENCOUNTER — Ambulatory Visit
Admission: RE | Admit: 2023-04-27 | Discharge: 2023-04-27 | Disposition: A | Discharge status: Home / Self Care | Payer: BC Managed Care – PPO | Source: Ambulatory Visit | Attending: Certified Nurse Midwife | Admitting: Certified Nurse Midwife

## 2023-04-27 DIAGNOSIS — N6315 Unspecified lump in the right breast, overlapping quadrants: Secondary | ICD-10-CM | POA: Diagnosis not present

## 2023-04-27 DIAGNOSIS — N6312 Unspecified lump in the right breast, upper inner quadrant: Secondary | ICD-10-CM | POA: Insufficient documentation

## 2023-04-27 DIAGNOSIS — N6001 Solitary cyst of right breast: Secondary | ICD-10-CM | POA: Diagnosis not present

## 2023-04-27 DIAGNOSIS — R92333 Mammographic heterogeneous density, bilateral breasts: Secondary | ICD-10-CM | POA: Diagnosis not present

## 2023-06-26 NOTE — Progress Notes (Unsigned)
PCP: Rica Records, PA-C   No chief complaint on file.   HPI:      [name] is a 47 y.o. [phone] whose LMP was No LMP recorded., presents today for her annual examination.  Her menses are regular every 28-30 days, lasting 6 days.  Menses a little heavier now. Changing products Q1-2 hrs for 2 heavy days. Dysmenorrhea none. She does not have intermenstrual bleeding. She does not have vasomotor sx.   Sex activity: sexually active--contraception vasectomy. She does not have vaginal dryness.  Last Pap: 03/15/19 Results were: no abnormalities /neg HPV DNA.  Hx of STDs: none  Last mammogram: 04/27/23 Results were: normal with RT breast cysts--routine follow-up in 12 months. There is no FH of breast cancer. There is no FH of ovarian cancer. The patient does do self-breast exams.  Colonoscopy: 2020 WNL except hemorrhoids; Repeat due after ?10 years.   Tobacco use: The patient denies current or previous tobacco use. Alcohol use: none No drug use Exercise: moderately active  She does get adequate calcium and Vitamin D in her diet.  Labs with PCP.  Past Medical History:  Diagnosis Date   Constipation    Fibrocystic breast changes 2019   Birads 2 mammogram   Hemorrhoids    History of kidney stones    Left ureteral stone 2019   Rectal bleeding     Past Surgical History:  Procedure Laterality Date   COLONOSCOPY WITH PROPOFOL N/A 06/28/2019   Procedure: COLONOSCOPY WITH PROPOFOL;  Surgeon: Toney Reil, MD;  Location: [facility] ENDOSCOPY;  Service: Gastroenterology;  Laterality: N/A;   ESOPHAGOGASTRODUODENOSCOPY (EGD) WITH PROPOFOL N/A 06/28/2019   Procedure: ESOPHAGOGASTRODUODENOSCOPY (EGD) WITH PROPOFOL;  Surgeon: Toney Reil, MD;  Location: [facility] ENDOSCOPY;  Service: Gastroenterology;  Laterality: N/A;   HAND SURGERY     as a child    Family History  Problem Relation Age of Onset   Diabetes Father    Hyperlipidemia Father    Esophageal cancer Father 63    Hypertension Father    Thyroid disease Mother    Osteoporosis Mother    Stroke Mother    Breast cancer Neg Hx     Social History   Socioeconomic History   Marital status: Married    Spouse name: Not on file   Number of children: 2   Years of education: Not on file   Highest education level: Master's degree (e.g., MA, MS, MEng, MEd, MSW, MBA)  Occupational History   Occupation: Oncologist  Tobacco Use   Smoking status: Never   Smokeless tobacco: Never  Vaping Use   Vaping status: Never Used  Substance and Sexual Activity   Alcohol use: Never   Drug use: Never   Sexual activity: Yes    Birth control/protection: None, Other-see comments    Comment: vasectomy  Other Topics Concern   Not on file  Social History Narrative   Not on file   Social Determinants of Health   Financial Resource Strain: Not on file  Food Insecurity: Not on file  Transportation Needs: Not on file  Physical Activity: Sufficiently Active (03/15/2019)   Exercise Vital Sign    Days of Exercise per Week: 4 days    Minutes of Exercise per Session: 40 min  Stress: No Stress Concern Present (01/10/2018)   [employer] of Occupational Health - Occupational Stress Questionnaire    Feeling of Stress : Not at all  Social Connections: Not on file  Intimate Partner  Violence: Not on file     Current Outpatient Medications:    docusate sodium (COLACE) 100 MG capsule, Take 100 mg by mouth 2 (two) times daily., Disp: , Rfl:    famotidine (PEPCID) 40 MG tablet, Take 40 mg by mouth at bedtime., Disp: , Rfl:    Multiple Vitamin (MULTI-VITAMIN) tablet, Take by mouth., Disp: , Rfl:    RABEprazole (ACIPHEX) 20 MG tablet, Take 20 mg by mouth daily., Disp: , Rfl:      ROS:  Review of Systems  Constitutional:  Negative for fatigue, fever and unexpected weight change.  Respiratory:  Negative for cough, shortness of breath and wheezing.   Cardiovascular:  Negative for chest pain, palpitations  and leg swelling.  Gastrointestinal:  Positive for constipation. Negative for blood in stool, diarrhea, nausea and vomiting.  Endocrine: Negative for cold intolerance, heat intolerance and polyuria.  Genitourinary:  Negative for dyspareunia, dysuria, flank pain, frequency, genital sores, hematuria, menstrual problem, pelvic pain, urgency, vaginal bleeding, vaginal discharge and vaginal pain.  Musculoskeletal:  Negative for back pain, joint swelling and myalgias.  Skin:  Negative for rash.  Neurological:  Negative for dizziness, syncope, light-headedness, numbness and headaches.  Hematological:  Negative for adenopathy.  Psychiatric/Behavioral:  Negative for agitation, confusion, sleep disturbance and suicidal ideas. The patient is not nervous/anxious.    BREAST: No symptoms    Objective: There were no vitals taken for this visit.   Physical Exam Constitutional:      Appearance: She is well-developed.  Genitourinary:     Vulva normal.     Right Labia: No rash, tenderness or lesions.    Left Labia: No tenderness, lesions or rash.    No vaginal discharge, erythema or tenderness.      Right Adnexa: not tender and no mass present.    Left Adnexa: not tender and no mass present.    No cervical friability or polyp.     Uterus is not enlarged or tender.  Breasts:    Right: No mass, nipple discharge, skin change or tenderness.     Left: No mass, nipple discharge, skin change or tenderness.  Neck:     Thyroid: No thyromegaly.  Cardiovascular:     Rate and Rhythm: Normal rate and regular rhythm.     Heart sounds: Normal heart sounds. No murmur heard. Pulmonary:     Effort: Pulmonary effort is normal.     Breath sounds: Normal breath sounds.  Abdominal:     Palpations: Abdomen is soft.     Tenderness: There is no abdominal tenderness. There is no guarding or rebound.  Musculoskeletal:        General: Normal range of motion.     Cervical back: Normal range of motion.   Lymphadenopathy:     Cervical: No cervical adenopathy.  Neurological:     General: No focal deficit present.     Mental Status: She is alert and oriented to person, place, and time.     Cranial Nerves: No cranial nerve deficit.  Skin:    General: Skin is warm and dry.  Psychiatric:        Mood and Affect: Mood normal.        Behavior: Behavior normal.        Thought Content: Thought content normal.        Judgment: Judgment normal.  Vitals reviewed.     Assessment/Plan:  Encounter for annual routine gynecological examination  Encounter for screening mammogram for malignant neoplasm of breast -  Plan: MM 3D SCREEN BREAST BILATERAL; pt to sched mammo          GYN counsel breast self exam, mammography screening, adequate intake of calcium and vitamin D, diet and exercise    F/U  No follow-ups on file.  Lourdes Kucharski B. Skyelar Swigart, PA-C 06/26/2023 5:35 PM

## 2023-06-27 ENCOUNTER — Other Ambulatory Visit (HOSPITAL_COMMUNITY)
Admission: RE | Admit: 2023-06-27 | Discharge: 2023-06-27 | Disposition: A | Discharge status: Home / Self Care | Payer: BC Managed Care – PPO | Source: Ambulatory Visit | Attending: Obstetrics and Gynecology | Admitting: Obstetrics and Gynecology

## 2023-06-27 ENCOUNTER — Ambulatory Visit (INDEPENDENT_AMBULATORY_CARE_PROVIDER_SITE_OTHER): Payer: BC Managed Care – PPO | Admitting: Obstetrics and Gynecology

## 2023-06-27 ENCOUNTER — Encounter: Payer: Self-pay | Admitting: Obstetrics and Gynecology

## 2023-06-27 VITALS — BP 102/70 | Ht 69.0 in | Wt 164.0 lb

## 2023-06-27 DIAGNOSIS — N6312 Unspecified lump in the right breast, upper inner quadrant: Secondary | ICD-10-CM

## 2023-06-27 DIAGNOSIS — Z0000 Encounter for general adult medical examination without abnormal findings: Secondary | ICD-10-CM

## 2023-06-27 DIAGNOSIS — Z01419 Encounter for gynecological examination (general) (routine) without abnormal findings: Secondary | ICD-10-CM | POA: Diagnosis not present

## 2023-06-27 DIAGNOSIS — Z1151 Encounter for screening for human papillomavirus (HPV): Secondary | ICD-10-CM

## 2023-06-27 DIAGNOSIS — N946 Dysmenorrhea, unspecified: Secondary | ICD-10-CM

## 2023-06-27 DIAGNOSIS — Z1329 Encounter for screening for other suspected endocrine disorder: Secondary | ICD-10-CM | POA: Diagnosis not present

## 2023-06-27 DIAGNOSIS — Z124 Encounter for screening for malignant neoplasm of cervix: Secondary | ICD-10-CM

## 2023-06-27 DIAGNOSIS — N920 Excessive and frequent menstruation with regular cycle: Secondary | ICD-10-CM | POA: Diagnosis not present

## 2023-06-27 DIAGNOSIS — Z1231 Encounter for screening mammogram for malignant neoplasm of breast: Secondary | ICD-10-CM

## 2023-06-27 DIAGNOSIS — K640 First degree hemorrhoids: Secondary | ICD-10-CM

## 2023-06-27 DIAGNOSIS — Z13220 Encounter for screening for lipoid disorders: Secondary | ICD-10-CM

## 2023-06-27 NOTE — Patient Instructions (Signed)
I value your feedback and you entrusting us with your care. If you get a [facility] patient survey, I would appreciate you taking the time to let us know about your experience today. Thank you! ? ? ?

## 2023-06-28 LAB — COMPREHENSIVE METABOLIC PANEL
ALT: 7 [IU]/L (ref 0–32)
AST: 14 [IU]/L (ref 0–40)
Albumin: 4 g/dL (ref 3.9–4.9)
Alkaline Phosphatase: 66 [IU]/L (ref 44–121)
BUN/Creatinine Ratio: 20 (ref 9–23)
BUN: 16 mg/dL (ref 6–24)
Bilirubin Total: 0.3 mg/dL (ref 0.0–1.2)
CO2: 24 mmol/L (ref 20–29)
Calcium: 9.3 mg/dL (ref 8.7–10.2)
Chloride: 104 mmol/L (ref 96–106)
Creatinine, Ser: 0.8 mg/dL (ref 0.57–1.00)
Globulin, Total: 2.6 g/dL (ref 1.5–4.5)
Glucose: 86 mg/dL (ref 70–99)
Potassium: 4.5 mmol/L (ref 3.5–5.2)
Sodium: 139 mmol/L (ref 134–144)
Total Protein: 6.6 g/dL (ref 6.0–8.5)
eGFR: 91 mL/min/{1.73_m2} (ref >59)

## 2023-06-28 LAB — LIPID PANEL
Chol/HDL Ratio: 2.9 {ratio} (ref 0.0–4.4)
Cholesterol, Total: 184 mg/dL (ref 100–199)
HDL: 64 mg/dL (ref >39)
LDL Chol Calc (NIH): 109 mg/dL — ABNORMAL HIGH (ref 0–99)
Triglycerides: 55 mg/dL (ref 0–149)
VLDL Cholesterol Cal: 11 mg/dL (ref 5–40)

## 2023-06-28 LAB — CBC WITH DIFFERENTIAL/PLATELET
Basophils Absolute: 0.1 10*3/uL (ref 0.0–0.2)
Basos: 1 %
EOS (ABSOLUTE): 0.1 10*3/uL (ref 0.0–0.4)
Eos: 2 %
Hematocrit: 35.6 % (ref 34.0–46.6)
Hemoglobin: 10.7 g/dL — ABNORMAL LOW (ref 11.1–15.9)
Immature Grans (Abs): 0 10*3/uL (ref 0.0–0.1)
Immature Granulocytes: 0 %
Lymphocytes Absolute: 1.1 10*3/uL (ref 0.7–3.1)
Lymphs: 21 %
MCH: 24.3 pg — ABNORMAL LOW (ref 26.6–33.0)
MCHC: 30.1 g/dL — ABNORMAL LOW (ref 31.5–35.7)
MCV: 81 fL (ref 79–97)
Monocytes Absolute: 0.4 10*3/uL (ref 0.1–0.9)
Monocytes: 8 %
Neutrophils Absolute: 3.6 10*3/uL (ref 1.4–7.0)
Neutrophils: 68 %
Platelets: 318 10*3/uL (ref 150–450)
RBC: 4.4 x10E6/uL (ref 3.77–5.28)
RDW: 14.3 % (ref 11.7–15.4)
WBC: 5.2 10*3/uL (ref 3.4–10.8)

## 2023-06-28 LAB — T4, FREE: Free T4: 1.18 ng/dL (ref 0.82–1.77)

## 2023-06-28 LAB — TSH: TSH: 1.26 u[IU]/mL (ref 0.450–4.500)

## 2023-07-03 LAB — CYTOLOGY - PAP
Comment: NEGATIVE
Diagnosis: NEGATIVE
High risk HPV: NEGATIVE

## 2023-07-20 ENCOUNTER — Ambulatory Visit
Admission: RE | Admit: 2023-07-20 | Discharge: 2023-07-20 | Disposition: A | Discharge status: Home / Self Care | Payer: BC Managed Care – PPO | Source: Ambulatory Visit | Attending: Obstetrics and Gynecology | Admitting: Obstetrics and Gynecology

## 2023-07-20 DIAGNOSIS — N920 Excessive and frequent menstruation with regular cycle: Secondary | ICD-10-CM | POA: Insufficient documentation

## 2023-07-20 DIAGNOSIS — D259 Leiomyoma of uterus, unspecified: Secondary | ICD-10-CM | POA: Diagnosis not present

## 2023-07-20 DIAGNOSIS — N946 Dysmenorrhea, unspecified: Secondary | ICD-10-CM | POA: Insufficient documentation

## 2023-07-20 DIAGNOSIS — N83201 Unspecified ovarian cyst, right side: Secondary | ICD-10-CM | POA: Diagnosis not present

## 2023-08-13 ENCOUNTER — Other Ambulatory Visit: Payer: Self-pay | Admitting: Obstetrics and Gynecology

## 2023-08-13 DIAGNOSIS — N83201 Unspecified ovarian cyst, right side: Secondary | ICD-10-CM

## 2023-08-14 ENCOUNTER — Encounter: Payer: Self-pay | Admitting: Obstetrics and Gynecology

## 2023-08-15 DIAGNOSIS — M720 Palmar fascial fibromatosis [Dupuytren]: Secondary | ICD-10-CM | POA: Diagnosis not present

## 2023-09-05 DIAGNOSIS — M720 Palmar fascial fibromatosis [Dupuytren]: Secondary | ICD-10-CM | POA: Diagnosis not present

## 2023-10-02 ENCOUNTER — Encounter: Payer: Self-pay | Admitting: Obstetrics and Gynecology

## 2023-10-02 DIAGNOSIS — Z13220 Encounter for screening for lipoid disorders: Secondary | ICD-10-CM | POA: Diagnosis not present

## 2023-10-02 DIAGNOSIS — E538 Deficiency of other specified B group vitamins: Secondary | ICD-10-CM | POA: Diagnosis not present

## 2023-10-02 DIAGNOSIS — Z0000 Encounter for general adult medical examination without abnormal findings: Secondary | ICD-10-CM | POA: Diagnosis not present

## 2023-10-02 DIAGNOSIS — Z131 Encounter for screening for diabetes mellitus: Secondary | ICD-10-CM | POA: Diagnosis not present

## 2023-10-02 DIAGNOSIS — Z1389 Encounter for screening for other disorder: Secondary | ICD-10-CM | POA: Diagnosis not present

## 2023-10-05 ENCOUNTER — Ambulatory Visit
Admission: RE | Admit: 2023-10-05 | Discharge: 2023-10-05 | Disposition: A | Discharge status: Home / Self Care | Payer: BC Managed Care – PPO | Source: Ambulatory Visit | Attending: Obstetrics and Gynecology | Admitting: Obstetrics and Gynecology

## 2023-10-05 DIAGNOSIS — N83201 Unspecified ovarian cyst, right side: Secondary | ICD-10-CM | POA: Diagnosis not present

## 2023-10-05 DIAGNOSIS — N888 Other specified noninflammatory disorders of cervix uteri: Secondary | ICD-10-CM | POA: Diagnosis not present

## 2023-10-05 DIAGNOSIS — D252 Subserosal leiomyoma of uterus: Secondary | ICD-10-CM | POA: Diagnosis not present

## 2023-11-03 DIAGNOSIS — E538 Deficiency of other specified B group vitamins: Secondary | ICD-10-CM | POA: Diagnosis not present

## 2023-11-03 DIAGNOSIS — D509 Iron deficiency anemia, unspecified: Secondary | ICD-10-CM | POA: Diagnosis not present

## 2024-03-18 ENCOUNTER — Other Ambulatory Visit: Payer: Self-pay | Admitting: Obstetrics and Gynecology

## 2024-03-18 DIAGNOSIS — Z1231 Encounter for screening mammogram for malignant neoplasm of breast: Secondary | ICD-10-CM

## 2024-05-03 ENCOUNTER — Encounter

## 2024-05-17 ENCOUNTER — Ambulatory Visit
Admission: RE | Admit: 2024-05-17 | Discharge: 2024-05-17 | Disposition: A | Discharge status: Home / Self Care | Source: Ambulatory Visit | Attending: Obstetrics and Gynecology | Admitting: Obstetrics and Gynecology

## 2024-05-17 DIAGNOSIS — Z1231 Encounter for screening mammogram for malignant neoplasm of breast: Secondary | ICD-10-CM | POA: Insufficient documentation

## 2024-05-21 ENCOUNTER — Ambulatory Visit: Payer: Self-pay | Admitting: Obstetrics and Gynecology

## 2024-07-01 DIAGNOSIS — H00021 Hordeolum internum right upper eyelid: Secondary | ICD-10-CM | POA: Diagnosis not present

## 2024-07-16 ENCOUNTER — Encounter: Payer: Self-pay | Admitting: Obstetrics and Gynecology

## 2024-08-06 DIAGNOSIS — D1801 Hemangioma of skin and subcutaneous tissue: Secondary | ICD-10-CM | POA: Diagnosis not present

## 2024-09-20 DIAGNOSIS — R1032 Left lower quadrant pain: Secondary | ICD-10-CM | POA: Diagnosis not present

## 2024-09-20 DIAGNOSIS — R112 Nausea with vomiting, unspecified: Secondary | ICD-10-CM | POA: Diagnosis not present

## 2024-09-20 DIAGNOSIS — R109 Unspecified abdominal pain: Secondary | ICD-10-CM | POA: Diagnosis not present

## 2024-10-16 ENCOUNTER — Other Ambulatory Visit: Payer: Self-pay | Admitting: Internal Medicine

## 2024-10-16 DIAGNOSIS — K8050 Calculus of bile duct without cholangitis or cholecystitis without obstruction: Secondary | ICD-10-CM

## 2024-10-17 ENCOUNTER — Other Ambulatory Visit: Payer: Self-pay | Admitting: Internal Medicine

## 2024-10-17 DIAGNOSIS — K8050 Calculus of bile duct without cholangitis or cholecystitis without obstruction: Secondary | ICD-10-CM

## 2024-10-23 ENCOUNTER — Ambulatory Visit
Admission: RE | Admit: 2024-10-23 | Discharge: 2024-10-23 | Disposition: A | Discharge status: Home / Self Care | Source: Ambulatory Visit | Attending: Internal Medicine | Admitting: Internal Medicine

## 2024-10-23 DIAGNOSIS — K8050 Calculus of bile duct without cholangitis or cholecystitis without obstruction: Secondary | ICD-10-CM | POA: Diagnosis present

## 2024-10-30 ENCOUNTER — Ambulatory Visit
Admission: RE | Admit: 2024-10-30 | Discharge: 2024-10-30 | Disposition: A | Source: Ambulatory Visit | Attending: Internal Medicine | Admitting: Internal Medicine

## 2024-10-30 DIAGNOSIS — K8050 Calculus of bile duct without cholangitis or cholecystitis without obstruction: Secondary | ICD-10-CM

## 2024-10-30 MED ORDER — TECHNETIUM TC 99M MEBROFENIN IV KIT
5.4300 | PACK | Freq: Once | INTRAVENOUS | Status: AC | PRN
  Administered 2024-10-30: 5.43 via INTRAVENOUS

## 2024-11-01 ENCOUNTER — Encounter: Payer: Self-pay | Admitting: Certified Nurse Midwife

## 2024-11-01 ENCOUNTER — Ambulatory Visit: Admitting: Certified Nurse Midwife

## 2024-11-01 VITALS — BP 99/60 | HR 76 | Ht 69.0 in | Wt 169.3 lb

## 2024-11-01 DIAGNOSIS — Z01419 Encounter for gynecological examination (general) (routine) without abnormal findings: Secondary | ICD-10-CM
